# Patient Record
Sex: Female | Born: 1995 | Race: White | Hispanic: No | Smoking: Current every day smoker
Health system: Southern US, Community
[De-identification: ages and names within clinical notes are randomized; demographics above are authoritative.]

## PROBLEM LIST (undated history)

## (undated) DIAGNOSIS — F419 Anxiety disorder, unspecified: Secondary | ICD-10-CM

## (undated) DIAGNOSIS — K219 Gastro-esophageal reflux disease without esophagitis: Secondary | ICD-10-CM

---

## 2016-05-03 ENCOUNTER — Ambulatory Visit
Admission: EM | Admit: 2016-05-03 | Discharge: 2016-05-03 | Disposition: A | Discharge status: Home / Self Care | Payer: Self-pay | Attending: Family Medicine | Admitting: Family Medicine

## 2016-05-03 DIAGNOSIS — L708 Other acne: Secondary | ICD-10-CM

## 2016-05-03 DIAGNOSIS — L309 Dermatitis, unspecified: Secondary | ICD-10-CM

## 2016-05-03 MED ORDER — CLOTRIMAZOLE-BETAMETHASONE 1-0.05 % EX CREA: TOPICAL_CREAM | CUTANEOUS | 0 refills | Status: DC

## 2016-05-03 MED ORDER — DOXYCYCLINE HYCLATE 100 MG PO TABS: 100.0000 mg | ORAL_TABLET | Freq: Two times a day (BID) | ORAL | 0 refills | Status: DC

## 2016-05-03 MED ORDER — FLUCONAZOLE 200 MG PO TABS: 200.0000 mg | ORAL_TABLET | Freq: Every day | ORAL | 0 refills | Status: AC

## 2016-05-03 NOTE — ED Provider Notes (Signed)
[facility]    CSN: [id number] Arrival date & time: 05/03/16  1027  First Provider Contact:  First MD Initiated Contact with Patient 05/03/16 1121        History   Chief Complaint Chief Complaint  Patient presents with  . Rash    HPI Sonya Ferguson is a 20 y.o. female.   The history is provided by the patient.  Rash  Location:  Torso Torso rash location:  L axilla, R axilla, L chest and R chest (cleavage area) Quality: itchiness, redness and scaling   Severity:  Mild Onset quality:  Gradual Timing:  Constant Progression:  Unchanged Chronicity:  Chronic Context: not animal contact, not food, not hot tub use, not insect bite/sting, not medications, not new detergent/soap, not nuts, not plant contact and not sun exposure   Relieved by:  Nothing Worsened by:  Nothing Ineffective treatments:  Anti-fungal cream Associated symptoms: no abdominal pain and no diarrhea     History reviewed. No pertinent past medical history.  There are no active problems to display for this patient.   History reviewed. No pertinent surgical history.  OB History    No data available       Home Medications    Prior to Admission medications   Medication Sig Start Date End Date Taking? Authorizing Provider  clotrimazole-betamethasone (LOTRISONE) cream Apply to affected area 2 times daily prn 05/03/16   Duanne Limerick, MD  doxycycline (VIBRA-TABS) 100 MG tablet Take 1 tablet (100 mg total) by mouth 2 (two) times daily. 05/03/16   Duanne Limerick, MD  fluconazole (DIFLUCAN) 200 MG tablet Take 1 tablet (200 mg total) by mouth daily. 05/03/16 05/10/16  Duanne Limerick, MD    Family History History reviewed. No pertinent family history.  Social History Social History  Substance Use Topics  . Smoking status: Current Every Day Smoker    Packs/day: 0.50    Types: Cigarettes  . Smokeless tobacco: Never Used  . Alcohol use No     Allergies   Review of patient's allergies  indicates no known allergies.   Review of Systems Review of Systems  Constitutional: Negative.   HENT: Negative.   Eyes: Negative.   Respiratory: Negative.   Cardiovascular: Negative.   Gastrointestinal: Negative for abdominal pain and diarrhea.  Endocrine: Negative.   Genitourinary: Negative.   Musculoskeletal: Negative.   Skin: Positive for rash. Negative for color change, pallor and wound.     Physical Exam Triage Vital Signs ED Triage Vitals  Enc Vitals Group     BP 05/03/16 1054 138/73     Pulse Rate 05/03/16 1054 91     Resp 05/03/16 1054 18     Temp 05/03/16 1054 98.3 F (36.8 C)     Temp Source 05/03/16 1054 Oral     SpO2 05/03/16 1054 99 %     Weight 05/03/16 1055 260 lb (117.9 kg)     Height 05/03/16 1055 5\' 5"  (1.651 m)     Head Circumference --      Peak Flow --      Pain Score 05/03/16 1056 0     Pain Loc --      Pain Edu? --      Excl. in GC? --    No data found.   Updated Vital Signs BP 138/73 (BP Location: Left Arm)   Pulse 91   Temp 98.3 F (36.8 C) (Oral)   Resp 18   Ht 5\' 5"  (1.651 m)  Wt 260 lb (117.9 kg)   LMP 04/06/2016   SpO2 99%   BMI 43.27 kg/m   Visual Acuity Right Eye Distance:   Left Eye Distance:   Bilateral Distance:    Right Eye Near:   Left Eye Near:    Bilateral Near:     Physical Exam  Constitutional: She appears well-developed and well-nourished. No distress.  HENT:  Head: Normocephalic.  Right Ear: External ear normal.  Left Ear: External ear normal.  Nose: Nose normal.  Mouth/Throat: Oropharynx is clear and moist.  Eyes: Pupils are equal, round, and reactive to light.  Neck: Normal range of motion.  Cardiovascular: Normal rate, regular rhythm, normal heart sounds and intact distal pulses.  Exam reveals no gallop and no friction rub.   No murmur heard. Pulmonary/Chest: Effort normal and breath sounds normal. No respiratory distress. She has no wheezes. She has no rales. She exhibits no tenderness.    Abdominal: Soft. There is no tenderness.  Skin: Rash noted. She is not diaphoretic. There is erythema.     UC Treatments / Results  Labs (all labs ordered are listed, but only abnormal results are displayed) Labs Reviewed - No data to display  EKG  EKG Interpretation None       Radiology No results found.  Procedures Procedures (including critical care time)  Medications Ordered in UC Medications - No data to display   Initial Impression / Assessment and Plan / UC Course  I have reviewed the triage vital signs and the nursing notes.  Pertinent labs & imaging results that were available during my care of the patient were reviewed by me and considered in my medical decision making (see chart for details).  Clinical Course      Final Clinical Impressions(s) / UC Diagnoses   Final diagnoses:  Acne-like skin bumps    New Prescriptions New Prescriptions   CLOTRIMAZOLE-BETAMETHASONE (LOTRISONE) CREAM    Apply to affected area 2 times daily prn   DOXYCYCLINE (VIBRA-TABS) 100 MG TABLET    Take 1 tablet (100 mg total) by mouth 2 (two) times daily.   FLUCONAZOLE (DIFLUCAN) 200 MG TABLET    Take 1 tablet (200 mg total) by mouth daily.     Duanne Limerickeanna C Ilisha Blust, MD 05/03/16 1143

## 2016-05-03 NOTE — ED Triage Notes (Addendum)
Patient c/o a rash on her chest and back area that will not clear up with previous prescribed medication. He states that she had a fungal rash in high school that looks similar.  She also c/o itching.

## 2018-03-26 ENCOUNTER — Emergency Department
Admission: EM | Admit: 2018-03-26 | Discharge: 2018-03-26 | Disposition: A | Discharge status: Home / Self Care | Payer: BLUE CROSS/BLUE SHIELD | Attending: Emergency Medicine | Admitting: Emergency Medicine

## 2018-03-26 ENCOUNTER — Other Ambulatory Visit: Payer: Self-pay

## 2018-03-26 DIAGNOSIS — S51812A Laceration without foreign body of left forearm, initial encounter: Secondary | ICD-10-CM | POA: Diagnosis not present

## 2018-03-26 DIAGNOSIS — Y929 Unspecified place or not applicable: Secondary | ICD-10-CM | POA: Insufficient documentation

## 2018-03-26 DIAGNOSIS — Y9389 Activity, other specified: Secondary | ICD-10-CM | POA: Diagnosis not present

## 2018-03-26 DIAGNOSIS — F17210 Nicotine dependence, cigarettes, uncomplicated: Secondary | ICD-10-CM | POA: Insufficient documentation

## 2018-03-26 DIAGNOSIS — Y999 Unspecified external cause status: Secondary | ICD-10-CM | POA: Diagnosis not present

## 2018-03-26 DIAGNOSIS — F329 Major depressive disorder, single episode, unspecified: Secondary | ICD-10-CM | POA: Insufficient documentation

## 2018-03-26 DIAGNOSIS — Z7289 Other problems related to lifestyle: Secondary | ICD-10-CM

## 2018-03-26 DIAGNOSIS — X781XXA Intentional self-harm by knife, initial encounter: Secondary | ICD-10-CM | POA: Diagnosis not present

## 2018-03-26 DIAGNOSIS — F1290 Cannabis use, unspecified, uncomplicated: Secondary | ICD-10-CM | POA: Diagnosis not present

## 2018-03-26 DIAGNOSIS — S59912A Unspecified injury of left forearm, initial encounter: Secondary | ICD-10-CM | POA: Diagnosis present

## 2018-03-26 DIAGNOSIS — IMO0002 Reserved for concepts with insufficient information to code with codable children: Secondary | ICD-10-CM

## 2018-03-26 LAB — COMPREHENSIVE METABOLIC PANEL
ALBUMIN: 4.5 g/dL (ref 3.5–5.0)
ALT: 16 U/L (ref 0–44)
AST: 19 U/L (ref 15–41)
Alkaline Phosphatase: 53 U/L (ref 38–126)
Anion gap: 7 (ref 5–15)
BUN: 20 mg/dL (ref 6–20)
CHLORIDE: 106 mmol/L (ref 98–111)
CO2: 25 mmol/L (ref 22–32)
Calcium: 9.3 mg/dL (ref 8.9–10.3)
Creatinine, Ser: 0.82 mg/dL (ref 0.44–1.00)
GFR calc Af Amer: >60 mL/min (ref >60)
GFR calc non Af Amer: >60 mL/min (ref >60)
GLUCOSE: 108 mg/dL — AB (ref 70–99)
POTASSIUM: 3.2 mmol/L — AB (ref 3.5–5.1)
Sodium: 138 mmol/L (ref 135–145)
Total Bilirubin: 0.7 mg/dL (ref 0.3–1.2)
Total Protein: 8 g/dL (ref 6.5–8.1)

## 2018-03-26 LAB — URINALYSIS, COMPLETE (UACMP) WITH MICROSCOPIC
Bacteria, UA: NONE SEEN
Bilirubin Urine: NEGATIVE
GLUCOSE, UA: NEGATIVE mg/dL
Ketones, ur: 5 mg/dL — AB
Leukocytes, UA: NEGATIVE
Nitrite: NEGATIVE
PROTEIN: NEGATIVE mg/dL
Specific Gravity, Urine: 1.027 (ref 1.005–1.030)
pH: 5 (ref 5.0–8.0)

## 2018-03-26 LAB — CBC WITH DIFFERENTIAL/PLATELET
BASOS ABS: 0.1 10*3/uL (ref 0–0.1)
Basophils Relative: 1 %
EOS PCT: 3 %
Eosinophils Absolute: 0.3 10*3/uL (ref 0–0.7)
HCT: 43 % (ref 35.0–47.0)
Hemoglobin: 14.8 g/dL (ref 12.0–16.0)
Lymphocytes Relative: 33 %
Lymphs Abs: 3.5 10*3/uL (ref 1.0–3.6)
MCH: 29.3 pg (ref 26.0–34.0)
MCHC: 34.5 g/dL (ref 32.0–36.0)
MCV: 84.8 fL (ref 80.0–100.0)
MONO ABS: 0.9 10*3/uL (ref 0.2–0.9)
Monocytes Relative: 8 %
NEUTROS ABS: 6 10*3/uL (ref 1.4–6.5)
Neutrophils Relative %: 55 %
PLATELETS: 254 10*3/uL (ref 150–440)
RBC: 5.07 MIL/uL (ref 3.80–5.20)
RDW: 13.3 % (ref 11.5–14.5)
WBC: 10.8 10*3/uL (ref 3.6–11.0)

## 2018-03-26 LAB — SALICYLATE LEVEL: Salicylate Lvl: <7 mg/dL (ref 2.8–30.0)

## 2018-03-26 LAB — URINE DRUG SCREEN, QUALITATIVE (ARMC ONLY)
Amphetamines, Ur Screen: NOT DETECTED
Barbiturates, Ur Screen: NOT DETECTED
CANNABINOID 50 NG, UR ~~LOC~~: POSITIVE — AB
Cocaine Metabolite,Ur ~~LOC~~: NOT DETECTED
MDMA (ECSTASY) UR SCREEN: NOT DETECTED
Methadone Scn, Ur: NOT DETECTED
Opiate, Ur Screen: NOT DETECTED
PHENCYCLIDINE (PCP) UR S: NOT DETECTED
Tricyclic, Ur Screen: NOT DETECTED

## 2018-03-26 LAB — ETHANOL: Alcohol, Ethyl (B): <10 mg/dL (ref <10)

## 2018-03-26 LAB — ACETAMINOPHEN LEVEL: Acetaminophen (Tylenol), Serum: <10 ug/mL — ABNORMAL LOW (ref 10–30)

## 2018-03-26 MED ORDER — LIDOCAINE HCL (PF) 1 % IJ SOLN
INTRAMUSCULAR | Status: AC
  Administered 2018-03-26: 2 mL
  Filled 2018-03-26: qty 5

## 2018-03-26 MED ORDER — LIDOCAINE HCL (PF) 1 % IJ SOLN
2.0000 mL | Freq: Once | INTRAMUSCULAR | Status: AC
  Administered 2018-03-26: 2 mL

## 2018-03-26 MED ORDER — LORAZEPAM 1 MG PO TABS
1.0000 mg | ORAL_TABLET | Freq: Once | ORAL | Status: AC
  Administered 2018-03-26: 1 mg via ORAL
  Filled 2018-03-26: qty 1

## 2018-03-26 NOTE — ED Notes (Signed)
SOC given report on patient[facility]

## 2018-03-26 NOTE — ED Notes (Signed)
Pt dressing for discharge. Pt accepting of disposition.   Maintained on 15 minute checks and observation by security camera for safety.

## 2018-03-26 NOTE — ED Provider Notes (Signed)
[facility] Emergency Department Provider Note   ____________________________________________   First MD Initiated Contact with Patient 03/26/18 0209     (approximate)  I have reviewed the triage vital signs and the nursing notes.   HISTORY  Chief Complaint Extremity Laceration    HPI Sonya Ferguson is a 22 y.o. female brought to the ED via EMS from home under IVC with a chief complaint of self-inflicted wound.  Patient reports she was arguing with her boyfriend and was holding a clean kitchen knife.  States she did not mean to cut herself.  Denies prior psychiatric illness or hospitalization.  Currently denies active SI/HI/AH/VH.  Tetanus is up-to-date.  Other than left forearm laceration, patient voices no medical complaints.   Past medical history None  There are no active problems to display for this patient.   History reviewed. No pertinent surgical history.  Prior to Admission medications   Medication Sig Start Date End Date Taking? Authorizing Provider  clotrimazole-betamethasone (LOTRISONE) cream Apply to affected area 2 times daily prn Patient not taking: Reported on 03/26/2018 05/03/16   Duanne Limerick, MD  doxycycline (VIBRA-TABS) 100 MG tablet Take 1 tablet (100 mg total) by mouth 2 (two) times daily. Patient not taking: Reported on 03/26/2018 05/03/16   Duanne Limerick, MD    Allergies Patient has no known allergies.  No family history on file.  Social History Social History   Tobacco Use  . Smoking status: Current Every Day Smoker    Packs/day: 0.50    Types: Cigarettes  . Smokeless tobacco: Never Used  Substance Use Topics  . Alcohol use: No  . Drug use: No    Review of Systems  Constitutional: No fever/chills Eyes: No visual changes. ENT: No sore throat. Cardiovascular: Denies chest pain. Respiratory: Denies shortness of breath. Gastrointestinal: No abdominal pain.  No nausea, no vomiting.  No diarrhea.  No  constipation. Genitourinary: Negative for dysuria. Musculoskeletal: Positive for left forearm laceration.  Negative for back pain. Skin: Negative for rash. Neurological: Negative for headaches, focal weakness or numbness. Psychiatric:Positive for self-inflicted wound.   ____________________________________________   PHYSICAL EXAM:  VITAL SIGNS: ED Triage Vitals  Enc Vitals Group     BP --      Pulse --      Resp --      Temp --      Temp src --      SpO2 --      Weight 03/26/18 0203 206 lb (93.4 kg)     Height 03/26/18 0203 5\' 6"  (1.676 m)     Head Circumference --      Peak Flow --      Pain Score 03/26/18 0202 3     Pain Loc --      Pain Edu? --      Excl. in GC? --     Constitutional: Alert and oriented. Well appearing and in mild acute distress. Eyes: Conjunctivae are normal. PERRL. EOMI. Head: Atraumatic. Nose: No congestion/rhinnorhea. Mouth/Throat: Mucous membranes are moist.  Oropharynx non-erythematous. Neck: No stridor.   Cardiovascular: Normal rate, regular rhythm. Grossly normal heart sounds.  Good peripheral circulation. Respiratory: Normal respiratory effort.  No retractions. Lungs CTAB. Gastrointestinal: Soft and nontender. No distention. No abdominal bruits. No CVA tenderness. Musculoskeletal:  Left forearm: Approximately 5 cm transverse linear laceration across midforearm with adipose tissue only.  No active bleeding.  2+ radial pulse.  Brisk, less than 5-second capillary refill. No lower extremity tenderness nor  edema.  No joint effusions. Neurologic:  Normal speech and language. No gross focal neurologic deficits are appreciated. No gait instability. Skin:  Skin is warm, dry and intact. No rash noted. Psychiatric: Mood and affect are normal. Speech and behavior are normal.  ____________________________________________   LABS (all labs ordered are listed, but only abnormal results are displayed)  Labs Reviewed  COMPREHENSIVE METABOLIC PANEL -  Abnormal; Notable for the following components:      Result Value   Potassium 3.2 (*)    Glucose, Bld 108 (*)    All other components within normal limits  ACETAMINOPHEN LEVEL - Abnormal; Notable for the following components:   Acetaminophen (Tylenol), Serum <10 (*)    All other components within normal limits  URINALYSIS, COMPLETE (UACMP) WITH MICROSCOPIC - Abnormal; Notable for the following components:   Color, Urine YELLOW (*)    APPearance CLEAR (*)    Hgb urine dipstick MODERATE (*)    Ketones, ur 5 (*)    All other components within normal limits  URINE DRUG SCREEN, QUALITATIVE ([facility] ONLY) - Abnormal; Notable for the following components:   Cannabinoid 50 Ng, Ur [place] POSITIVE (*)    Benzodiazepine, Ur Scrn TEST NOT PERFORMED, REAGENT NOT AVAILABLE (*)    All other components within normal limits  CBC WITH DIFFERENTIAL/PLATELET  SALICYLATE LEVEL  ETHANOL  POC URINE PREG, ED   ____________________________________________  EKG  None ____________________________________________  RADIOLOGY  ED MD interpretation: None  Official radiology report(s): No results found.  ____________________________________________   PROCEDURES  Procedure(s) performed:    Marland Kitchen.Marland Kitchen.Laceration Repair Date/Time: 03/26/2018 2:45 AM Performed by: Irean HongSung, Jade J, MD Authorized by: Irean HongSung, Jade J, MD   Consent:    Consent obtained:  Verbal   Consent given by:  Patient   Risks discussed:  Infection, pain, poor cosmetic result and poor wound healing   Alternatives discussed:  No treatment Anesthesia (see MAR for exact dosages):    Anesthesia method:  Local infiltration   Local anesthetic:  Lidocaine 1% w/o epi Laceration details:    Location:  Shoulder/arm   Shoulder/arm location:  L lower arm   Length (cm):  5   Depth (mm):  1 Repair type:    Repair type:  Simple Pre-procedure details:    Preparation:  Patient was prepped and draped in usual sterile fashion Exploration:    Hemostasis  achieved with:  Direct pressure   Wound exploration: entire depth of wound probed and visualized     Contaminated: no   Treatment:    Area cleansed with:  Saline and Betadine   Amount of cleaning:  Standard   Irrigation solution:  Sterile saline   Irrigation method:  Pressure wash   Visualized foreign bodies/material removed: no   Skin repair:    Repair method:  Sutures   Suture size:  5-0   Suture technique:  Running Approximation:    Approximation:  Close Post-procedure details:    Dressing:  Sterile dressing   Patient tolerance of procedure:  Tolerated well, no immediate complications    Critical Care performed: No  ____________________________________________   INITIAL IMPRESSION / ASSESSMENT AND PLAN / ED COURSE  As part of my medical decision making, I reviewed the following data within the electronic MEDICAL RECORD NUMBER Nursing notes reviewed and incorporated, Labs reviewed, A consult was requested and obtained from this/these consultant(s) Psychiatry and Notes from prior ED visits   22 year old female who presents to the ED under IVC for self-inflicted wound.  Wound repaired  with sutures.  Patient will remain under IVC pending psychiatry evaluation and disposition.  Clinical Course as of Mar 27 615  Fri Mar 26, 2018  6045 Patient was evaluated by [facility] psychiatrist Dr. Jacky Kindle who recommends discharge home and will rescind IVC.  Strict return precautions given.  Patient verbalizes understanding agrees with plan of care.   [JS]    Clinical Course User Index [JS] Irean Hong, MD     ____________________________________________   FINAL CLINICAL IMPRESSION(S) / ED DIAGNOSES  Final diagnoses:  Self-inflicted injury  Laceration of left forearm, initial encounter  Marijuana use     ED Discharge Orders    None       Note:  This document was prepared using Dragon voice recognition software and may include unintentional dictation errors.    Irean Hong,  MD 03/26/18 [phone]

## 2018-03-26 NOTE — Discharge Instructions (Addendum)
Suture removal in 7 to 10 days. Return to the ER for worsening symptoms, increased redness/swelling around wound, purulent discharge, feelings of hurting yourself or others, or other concerns.

## 2018-03-26 NOTE — ED Notes (Signed)
Pt discharged to lobby with car keys. (car is in parking lot).  VS stable. Discharged paperwork reviewed with patient. Patient signed for discharge. All belongings returned to patient. Pt denies SI/HI.

## 2018-03-26 NOTE — ED Notes (Signed)
Pt belongings- 2 nipple rings, 2 nose rings, 1 top of dermmal ring,  1 lip ring, 2 black gauges, 3 ear rings, 1 tongue ring. Black necklace brown rock. Black t-shirt, black tank  pink bra, blue sports bra, pajamas pants,  Underwear, pink flip flops, 1 cell phone.

## 2018-03-26 NOTE — BH Assessment (Signed)
Assessment Note  Sonya Ferguson is an 22 y.o. female to ED via [facility] after cutting her wrist. Pt reportedly lost her job and had a dispute with boyfriend which caused her to become overwhelmed. Pt denies SI, HI, AH, and VH. Pt reports, "I don't think I had a thought process at the time. I lost my job and I just felt like I was failing everybody." Pt denies any behavioral health hx although she does admit to feeling as though she has depression. Pt denies any other triggers or potential conflicts. Pt reports she has felt unmotivated and withdrawn from others for quite some time. Pt also admits to weekly marijuana use.   Pt calm, tearful, and cooperative at time of assessment. Oriented x4.  Diagnosis: Depression  Past Medical History: History reviewed. No pertinent past medical history.  History reviewed. No pertinent surgical history.  Family History: No family history on file.  Social History:  reports that she has been smoking cigarettes. She has been smoking about 0.50 packs per day. She has never used smokeless tobacco. She reports that she does not drink alcohol or use drugs.  Additional Social History:  Alcohol / Drug Use Pain Medications: see PTA Prescriptions: see PTA Over the Counter: see PTA History of alcohol / drug use?: Yes Longest period of sobriety (when/how long): unable to quanitfy Substance #1 Name of Substance 1: marijuana 1 - Age of First Use: 19 1 - Amount (size/oz): varies 1 - Frequency: at least once per week 1 - Duration: varies 1 - Last Use / Amount: last Tuesday  CIWA: CIWA-Ar BP: (!) 129/91 Pulse Rate: 97 COWS:    Allergies: No Known Allergies  Home Medications:  (Not in a hospital admission)  OB/GYN Status:  Patient's last menstrual period was 03/16/2018 (exact date).  General Assessment Data Location of Assessment: [facility] ED TTS Assessment: In system Is this a Tele or Face-to-Face Assessment?: Face-to-Face Is this an Initial Assessment or a  Re-assessment for this encounter?: Initial Assessment Marital status: Single Is patient pregnant?: No Pregnancy Status: No Living Arrangements: Spouse/significant other Can pt return to current living arrangement?: Yes Admission Status: Involuntary Is patient capable of signing voluntary admission?: No Referral Source: Self/Family/Friend Insurance type: [employer] Screening Exam [facility] Walk-in ONLY) Medical Exam completed: Yes  Crisis Care Plan Living Arrangements: Spouse/significant other Legal Guardian: Other:(self) Name of Psychiatrist: none Name of Therapist: none  Education Status Is patient currently in school?: No Is the patient employed, unemployed or receiving disability?: Unemployed  Risk to self with the past 6 months Suicidal Ideation: No-Not Currently/Within Last 6 Months Has patient been a risk to self within the past 6 months prior to admission? : Yes Suicidal Intent: No-Not Currently/Within Last 6 Months Has patient had any suicidal intent within the past 6 months prior to admission? : Other (comment)(Pt denies despite self inflicted lacerations) Is patient at risk for suicide?: No, but patient needs Medical Clearance Suicidal Plan?: No Has patient had any suicidal plan within the past 6 months prior to admission? : No Access to Means: Yes Specify Access to Suicidal Means: pt has access to sharp objects, razors & knives What has been your use of drugs/alcohol within the last 12 months?: pt reports to smoking marijuna at least once per week Previous Attempts/Gestures: No How many times?: 0 Other Self Harm Risks: none reported Triggers for Past Attempts: None known Intentional Self Injurious Behavior: Cutting Comment - Self Injurious Behavior: pt presented to ED with self  inflicted cuts to wrist Family Suicide History: No Recent stressful life event(s): Conflict (Comment), Job Loss Persecutory voices/beliefs?: No Depression: Yes Depression  Symptoms: Despondent, Tearfulness, Isolating, Loss of interest in usual pleasures, Feeling worthless/self pity Substance abuse history and/or treatment for substance abuse?: Yes Suicide prevention information given to non-admitted patients: Not applicable  Risk to Others within the past 6 months Homicidal Ideation: No Does patient have any lifetime risk of violence toward others beyond the six months prior to admission? : No Thoughts of Harm to Others: No Current Homicidal Intent: No Current Homicidal Plan: No Access to Homicidal Means: No Identified Victim: None reported History of harm to others?: No Assessment of Violence: None Noted Violent Behavior Description: none noted Does patient have access to weapons?: No Criminal Charges Pending?: No Does patient have a court date: No Is patient on probation?: No  Psychosis Hallucinations: None noted Delusions: None noted  Mental Status Report Appearance/Hygiene: Unremarkable Eye Contact: Good Motor Activity: Freedom of movement Speech: Logical/coherent, Soft Level of Consciousness: Alert Mood: Depressed, Anxious Affect: Depressed Anxiety Level: Minimal Thought Processes: Coherent, Relevant Judgement: Partial Orientation: Appropriate for developmental age Obsessive Compulsive Thoughts/Behaviors: None  Cognitive Functioning Concentration: Good Memory: Remote Intact, Recent Impaired Is patient IDD: No Is patient DD?: No Insight: Fair Impulse Control: Poor Appetite: Good Have you had any weight changes? : No Change Sleep: No Change Total Hours of Sleep: 8 Vegetative Symptoms: Staying in bed  ADLScreening [facility] Patient's cognitive ability adequate to safely complete daily activities?: Yes Patient able to express need for assistance with ADLs?: Yes Independently performs ADLs?: Yes (appropriate for developmental age)  Prior Inpatient Therapy Prior Inpatient Therapy: No  Prior Outpatient  Therapy Prior Outpatient Therapy: No Does patient have an ACCT team?: No Does patient have Intensive In-House Services?  : No Does patient have [facility] services? : No Does patient have P4CC services?: No  ADL Screening (condition at time of admission) Patient's cognitive ability adequate to safely complete daily activities?: Yes Is the patient deaf or have difficulty hearing?: No Does the patient have difficulty seeing, even when wearing glasses/contacts?: No Does the patient have difficulty concentrating, remembering, or making decisions?: No Patient able to express need for assistance with ADLs?: Yes Does the patient have difficulty dressing or bathing?: No Independently performs ADLs?: Yes (appropriate for developmental age) Does the patient have difficulty walking or climbing stairs?: No Weakness of Legs: None Weakness of Arms/Hands: None  Home Assistive Devices/Equipment Home Assistive Devices/Equipment: None  Therapy Consults (therapy consults require a physician order) PT Evaluation Needed: No OT Evalulation Needed: No SLP Evaluation Needed: No Abuse/Neglect Assessment (Assessment to be complete while patient is alone) Abuse/Neglect Assessment Can Be Completed: Yes Physical Abuse: Denies Verbal Abuse: Denies Sexual Abuse: Denies Exploitation of patient/patient's resources: Denies Self-Neglect: Denies Values / Beliefs Cultural Requests During Hospitalization: None Spiritual Requests During Hospitalization: None Consults Spiritual Care Consult Needed: No Social Work Consult Needed: No Merchant navy officerAdvance Directives (For Healthcare) Does Patient Have a Medical Advance Directive?: No    Additional Information 1:1 In Past 12 Months?: No CIRT Risk: No Elopement Risk: No Does patient have medical clearance?: Yes     Disposition:  Disposition Initial Assessment Completed for this Encounter: Yes Disposition of Patient: Discharge Patient refused recommended treatment: No Mode  of transportation if patient is discharged?: Other Patient referred to: Other (Comment)  On Site Evaluation by:   Reviewed with Physician:    Aubery LappingJerrica  Jodeci Rini, MS, [facility] 03/26/2018 6:22 AM

## 2018-03-26 NOTE — ED Triage Notes (Addendum)
Pt arrives to ED via [facility] from home with c/o self-inflicted laceration to the left forearm. EMS reports a 2" transverse laceration across the mid-left forearm with subcutaneous fat tissue exposed. Pt denies previous h/x of cutting, no previous psych h/x. Pt denies drug or ETOH use PTA. [place] accompanied pt to the ED reports IVC paperwork is en route.

## 2018-03-26 NOTE — ED Notes (Signed)
ED Provider at bedside. 

## 2018-03-26 NOTE — ED Notes (Signed)
Patient woken by this Clinical research associatewriter and encouraged to eat breakfast. Patient informed her discharge paperwork was ready.  Patient will be given a little bit of time to fully wake up before leaving. Patient will be driving herself home. Denies SI/HI.  Denies pain.   Maintained on 15 minute checks and observation by security camera for safety.

## 2020-01-05 ENCOUNTER — Emergency Department
Admission: EM | Admit: 2020-01-05 | Discharge: 2020-01-05 | Disposition: A | Discharge status: Home / Self Care | Payer: BLUE CROSS/BLUE SHIELD | Attending: Emergency Medicine | Admitting: Emergency Medicine

## 2020-01-05 ENCOUNTER — Encounter: Payer: Self-pay | Admitting: Emergency Medicine

## 2020-01-05 ENCOUNTER — Other Ambulatory Visit: Payer: Self-pay

## 2020-01-05 ENCOUNTER — Emergency Department: Payer: BLUE CROSS/BLUE SHIELD

## 2020-01-05 DIAGNOSIS — Y9389 Activity, other specified: Secondary | ICD-10-CM | POA: Insufficient documentation

## 2020-01-05 DIAGNOSIS — Y929 Unspecified place or not applicable: Secondary | ICD-10-CM | POA: Diagnosis not present

## 2020-01-05 DIAGNOSIS — S93602A Unspecified sprain of left foot, initial encounter: Secondary | ICD-10-CM

## 2020-01-05 DIAGNOSIS — W1849XA Other slipping, tripping and stumbling without falling, initial encounter: Secondary | ICD-10-CM | POA: Insufficient documentation

## 2020-01-05 DIAGNOSIS — Y999 Unspecified external cause status: Secondary | ICD-10-CM | POA: Insufficient documentation

## 2020-01-05 DIAGNOSIS — S99922A Unspecified injury of left foot, initial encounter: Secondary | ICD-10-CM | POA: Diagnosis present

## 2020-01-05 DIAGNOSIS — F17210 Nicotine dependence, cigarettes, uncomplicated: Secondary | ICD-10-CM | POA: Insufficient documentation

## 2020-01-05 MED ORDER — NAPROXEN 500 MG PO TABS
500.0000 mg | ORAL_TABLET | Freq: Once | ORAL | Status: AC
  Administered 2020-01-05: 500 mg via ORAL
  Filled 2020-01-05: qty 1

## 2020-01-05 MED ORDER — NAPROXEN 375 MG PO TABS: 375.0000 mg | ORAL_TABLET | Freq: Two times a day (BID) | ORAL | 0 refills | Status: DC

## 2020-01-05 MED ORDER — TRAMADOL HCL 50 MG PO TABS: 50.0000 mg | ORAL_TABLET | Freq: Four times a day (QID) | ORAL | 0 refills | Status: DC | PRN

## 2020-01-05 NOTE — ED Triage Notes (Signed)
Patient reports pain in left food since waking up this morning. Worse when walking around. Denies any known injury.

## 2020-01-05 NOTE — ED Provider Notes (Signed)
[facility] Emergency Department Provider Note   ____________________________________________   First MD Initiated Contact with Patient 01/05/20 1400     (approximate)  I have reviewed the triage vital signs and the nursing notes.   HISTORY  Chief Complaint Foot Pain    HPI Sonya Ferguson is a 24 y.o. female patient complains of left foot pain since last night.  Patient that she got up to go to the bathroom and tripped over a cord.  Patient that she did not fall.  Patient says she was able to sleep without discomfort.  Patient stated waking this morning with mild pain to dorsal aspect left foot which increased while she was at work today.  Patient state her work requires prolonged standing.  Patient denies loss sensation or loss of function.  Patient rates her pain a 7/10.  Patient described pain is "achy".  No palliative measures for complaint.         History reviewed. No pertinent past medical history.  There are no problems to display for this patient.   History reviewed. No pertinent surgical history.  Prior to Admission medications   Medication Sig Start Date End Date Taking? Authorizing Provider  naproxen (NAPROSYN) 375 MG tablet Take 1 tablet (375 mg total) by mouth 2 (two) times daily with a meal. 01/05/20   Sable Feil, PA-C  traMADol (ULTRAM) 50 MG tablet Take 1 tablet (50 mg total) by mouth every 6 (six) hours as needed for moderate pain. 01/05/20   Sable Feil, PA-C    Allergies Patient has no known allergies.  No family history on file.  Social History Social History   Tobacco Use  . Smoking status: Current Every Day Smoker    Packs/day: 0.50    Types: Cigarettes  . Smokeless tobacco: Never Used  Substance Use Topics  . Alcohol use: No  . Drug use: No    Review of Systems .Constitutional: No fever/chills Eyes: No visual changes. ENT: No sore throat. Cardiovascular: Denies chest pain. Respiratory: Denies  shortness of breath. Gastrointestinal: No abdominal pain.  No nausea, no vomiting.  No diarrhea.  No constipation. Genitourinary: Negative for dysuria. Musculoskeletal: Left dorsal foot pain Skin: Negative for rash. Neurological: Negative for headaches, focal weakness or numbness.   ____________________________________________   PHYSICAL EXAM:  VITAL SIGNS: ED Triage Vitals  Enc Vitals Group     BP 01/05/20 1351 132/82     Pulse Rate 01/05/20 1351 93     Resp 01/05/20 1351 18     Temp 01/05/20 1351 99.3 F (37.4 C)     Temp Source 01/05/20 1351 Oral     SpO2 01/05/20 1351 99 %     Weight 01/05/20 1349 212 lb (96.2 kg)     Height 01/05/20 1349 5\' 6"  (1.676 m)     Head Circumference --      Peak Flow --      Pain Score 01/05/20 1348 7     Pain Loc --      Pain Edu? --      Excl. in [place]? --    Constitutional: Alert and oriented. Well appearing and in no acute distress. Cardiovascular: Normal rate, regular rhythm. Grossly normal heart sounds.  Good peripheral circulation. Respiratory: Normal respiratory effort.  No retractions. Lungs CTAB. Musculoskeletal: No obvious deformity to the left foot.  Neurologic:  Normal speech and language. No gross focal neurologic deficits are appreciated. No gait instability. Skin:  Skin is warm, dry and  intact. No rash noted.  No abrasion or ecchymosis. Psychiatric: Mood and affect are normal. Speech and behavior are normal.  ____________________________________________   LABS (all labs ordered are listed, but only abnormal results are displayed)  Labs Reviewed - No data to display ____________________________________________  EKG   ____________________________________________  RADIOLOGY  ED MD interpretation:    Official radiology report(s): DG Foot Complete Left  Result Date: 01/05/2020 CLINICAL DATA:  Pain and swelling EXAM: LEFT FOOT - COMPLETE 3+ VIEW COMPARISON:  None. FINDINGS: Frontal, oblique, and lateral views obtained.  No fracture or dislocation. Joint spaces appear normal. No erosive change. IMPRESSION: No fracture or dislocation.  No evident arthropathy. Electronically Signed   By: Bretta Bang III M.D.   On: 01/05/2020 14:18    ____________________________________________   PROCEDURES  Procedure(s) performed (including Critical Care):  Procedures   ____________________________________________   INITIAL IMPRESSION / ASSESSMENT AND PLAN / ED COURSE  As part of my medical decision making, I reviewed the following data within the electronic MEDICAL RECORD NUMBER     Patient presents for left foot pain.  Discussed neck x-ray findings with patient.  Patient complaint physical exam consistent with sprain foot.  Patient given discharge care instruction work note.  Patient Everlean Alstrom take medication as directed.  Patient advised [facility].    Sonya Ferguson was evaluated in Emergency Department on 01/05/2020 for the symptoms described in the history of present illness. She was evaluated in the context of the global COVID-19 pandemic, which necessitated consideration that the patient might be at risk for infection with the SARS-CoV-2 virus that causes COVID-19. Institutional protocols and algorithms that pertain to the evaluation of patients at risk for COVID-19 are in a state of rapid change based on information released by regulatory bodies including the CDC and federal and state organizations. These policies and algorithms were followed during the patient's care in the ED.       ____________________________________________   FINAL CLINICAL IMPRESSION(S) / ED DIAGNOSES  Final diagnoses:  Sprain of left foot, initial encounter     ED Discharge Orders         Ordered    naproxen (NAPROSYN) 375 MG tablet  2 times daily with meals     01/05/20 1518    traMADol (ULTRAM) 50 MG tablet  Every 6 hours PRN     01/05/20 1518           Note:  This document was  prepared using Dragon voice recognition software and may include unintentional dictation errors.    Joni Reining, PA-C 01/05/20 1522    Shaune Pollack, MD 01/05/20 2034

## 2020-11-22 ENCOUNTER — Emergency Department
Admission: EM | Admit: 2020-11-22 | Discharge: 2020-11-22 | Disposition: A | Discharge status: Home / Self Care | Payer: BLUE CROSS/BLUE SHIELD | Attending: Emergency Medicine | Admitting: Emergency Medicine

## 2020-11-22 ENCOUNTER — Other Ambulatory Visit: Payer: Self-pay

## 2020-11-22 ENCOUNTER — Emergency Department: Payer: BLUE CROSS/BLUE SHIELD

## 2020-11-22 DIAGNOSIS — S93401A Sprain of unspecified ligament of right ankle, initial encounter: Secondary | ICD-10-CM | POA: Insufficient documentation

## 2020-11-22 DIAGNOSIS — W1841XA Slipping, tripping and stumbling without falling due to stepping on object, initial encounter: Secondary | ICD-10-CM | POA: Diagnosis not present

## 2020-11-22 DIAGNOSIS — Z79899 Other long term (current) drug therapy: Secondary | ICD-10-CM | POA: Insufficient documentation

## 2020-11-22 DIAGNOSIS — F17210 Nicotine dependence, cigarettes, uncomplicated: Secondary | ICD-10-CM | POA: Diagnosis not present

## 2020-11-22 DIAGNOSIS — M25571 Pain in right ankle and joints of right foot: Secondary | ICD-10-CM | POA: Diagnosis present

## 2020-11-22 MED ORDER — MELOXICAM 7.5 MG PO TABS
15.0000 mg | ORAL_TABLET | Freq: Once | ORAL | Status: AC
  Administered 2020-11-22: 15 mg via ORAL
  Filled 2020-11-22: qty 2

## 2020-11-22 MED ORDER — MELOXICAM 15 MG PO TABS: 15.0000 mg | ORAL_TABLET | Freq: Every day | ORAL | 0 refills | Status: DC

## 2020-11-22 NOTE — ED Notes (Signed)
Patient reports "falling onto" right ankle after stepping out of her truck at work today. Patient is noted to have swelling to the lateral aspect of the right ankle. Ice provided. Elevation of extremity encouraged.

## 2020-11-22 NOTE — ED Notes (Signed)
Spoke with pts employer, Elam Dutch with [employer] and he stated that nothing was needed to file WC.

## 2020-11-22 NOTE — ED Provider Notes (Signed)
[facility] Emergency Department Provider Note  ____________________________________________  Time seen: Approximately 7:16 PM  I have reviewed the triage vital signs and the nursing notes.   HISTORY  Chief Complaint Ankle Pain    HPI Sonya Ferguson is a 25 y.o. female who presents the emergency department complaining of right ankle pain.  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  Patient is having pain, edema to the lateral aspect of the ankle but pain radiates across the anterior aspect.  Patient states that she can put weight on it but doing so drastically increases the pain.  No other injury or complaint is reported at this time.         No past medical history on file.  There are no problems to display for this patient.   No past surgical history on file.  Prior to Admission medications   Medication Sig Start Date End Date Taking? Authorizing Provider  meloxicam (MOBIC) 15 MG tablet Take 1 tablet (15 mg total) by mouth daily. 11/22/20  Yes Shaylene Paganelli, Delorise Royals, PA-C  naproxen (NAPROSYN) 375 MG tablet Take 1 tablet (375 mg total) by mouth 2 (two) times daily with a meal. 01/05/20   Joni Reining, PA-C  traMADol (ULTRAM) 50 MG tablet Take 1 tablet (50 mg total) by mouth every 6 (six) hours as needed for moderate pain. 01/05/20   Joni Reining, PA-C    Allergies Patient has no known allergies.  No family history on file.  Social History Social History   Tobacco Use  . Smoking status: Current Every Day Smoker    Packs/day: 0.50    Types: Cigarettes  . Smokeless tobacco: Never Used  Substance Use Topics  . Alcohol use: No  . Drug use: No     Review of Systems  Constitutional: No fever/chills Eyes: No visual changes. No discharge ENT: No upper respiratory complaints. Cardiovascular: no chest pain. Respiratory: no cough. No  SOB. Gastrointestinal: No abdominal pain.  No nausea, no vomiting.  No diarrhea.  No constipation. Musculoskeletal: Right ankle pain/injury Skin: Negative for rash, abrasions, lacerations, ecchymosis. Neurological: Negative for headaches, focal weakness or numbness.  10 System ROS otherwise negative.  ____________________________________________   PHYSICAL EXAM:  VITAL SIGNS: ED Triage Vitals  Enc Vitals Group     BP 11/22/20 1854 134/86     Pulse Rate 11/22/20 1854 92     Resp 11/22/20 1854 18     Temp 11/22/20 1854 99.5 F (37.5 C)     Temp Source 11/22/20 1854 Oral     SpO2 11/22/20 1854 99 %     Weight 11/22/20 1849 210 lb (95.3 kg)     Height 11/22/20 1849 5\' 6"  (1.676 m)     Head Circumference --      Peak Flow --      Pain Score 11/22/20 1849 7     Pain Loc --      Pain Edu? --      Excl. in [place]? --      Constitutional: Alert and oriented. Well appearing and in no acute distress. Eyes: Conjunctivae are normal. PERRL. EOMI. Head: Atraumatic. ENT:      Ears:       Nose: No congestion/rhinnorhea.      Mouth/Throat: Mucous membranes are moist.  Neck: No stridor.    Cardiovascular: Normal rate, regular rhythm. Normal S1 and  S2.  Good peripheral circulation. Respiratory: Normal respiratory effort without tachypnea or retractions. Lungs CTAB. Good air entry to the bases with no decreased or absent breath sounds. Musculoskeletal: Full range of motion to all extremities. No gross deformities appreciated.  Visualization of the right ankle reveals edema when compared with left.  Majority of edema lies along the anterolateral aspect of the ankle joint extending over the lateral malleolus.  Patient is nontender to palpation of the Achilles tendon with no palpable deficit.  No tenderness along the medial aspect.  Halfway across the anterior joint line, patient becomes tender with increasing edema to the area.  No palpable abnormalities about this region.  No crepitus.  Mild  extension around the talus with no extension into the metatarsals.  Good range of motion all digits.  Capillary refill less than 2 seconds all digits. Neurologic:  Normal speech and language. No gross focal neurologic deficits are appreciated.  Skin:  Skin is warm, dry and intact. No rash noted. Psychiatric: Mood and affect are normal. Speech and behavior are normal. Patient exhibits appropriate insight and judgement.   ____________________________________________   LABS (all labs ordered are listed, but only abnormal results are displayed)  Labs Reviewed - No data to display ____________________________________________  EKG   ____________________________________________  RADIOLOGY I personally viewed and evaluated these images as part of my medical decision making, as well as reviewing the written report by the radiologist.  ED Provider Interpretation: No acute osseous abnormality on xray. Specifically no fracture or dislocation  DG Ankle Complete Right  Result Date: 11/22/2020 CLINICAL DATA:  Right ankle injury EXAM: RIGHT ANKLE - COMPLETE 3+ VIEW COMPARISON:  None. FINDINGS: Mild lateral soft tissue swelling. No acute bony abnormality. Specifically, no fracture, subluxation, or dislocation. IMPRESSION: No acute bony abnormality. Electronically Signed   By: Charlett Nose M.D.   On: 11/22/2020 19:41   DG Foot Complete Right  Result Date: 11/22/2020 CLINICAL DATA:  Right ankle injury, lateral swelling EXAM: RIGHT FOOT COMPLETE - 3+ VIEW COMPARISON:  Ankle series today FINDINGS: Lateral soft tissue swelling over the ankle. No acute bony abnormality. Specifically, no fracture, subluxation, or dislocation. Joint spaces maintained. IMPRESSION: No acute bony abnormality. Electronically Signed   By: Charlett Nose M.D.   On: 11/22/2020 19:41    ____________________________________________    PROCEDURES  Procedure(s) performed:    Procedures    Medications  meloxicam (MOBIC) tablet  15 mg (has no administration in time range)     ____________________________________________   INITIAL IMPRESSION / ASSESSMENT AND PLAN / ED COURSE  Pertinent labs & imaging results that were available during my care of the patient were reviewed by me and considered in my medical decision making (see chart for details).  Review of the [place] CSRS was performed in accordance of the [facility] prior to dispensing any controlled drugs.           Patient's diagnosis is consistent with ankle sprain.  Patient presented to emergency department complaining of right ankle pain.  Patient states that she had injured her ankle while delivering packages earlier today.  She had swelling along the lateral malleolus.No gross deformity.  Patient was able to bear weight with difficulty.Imaging reveals no acute fracture or dislocation.  No widening of the ankle mortise joint.  Patient will be placed on crutches and given an ASO lace up stirrup ankle brace.  Meloxicam for symptom improvement.  Follow-up with orthopedics. Patient is given ED precautions to return to the ED for any worsening  or new symptoms.     ____________________________________________  FINAL CLINICAL IMPRESSION(S) / ED DIAGNOSES  Final diagnoses:  Sprain of right ankle, unspecified ligament, initial encounter      NEW MEDICATIONS STARTED DURING THIS VISIT:  ED Discharge Orders         Ordered    meloxicam (MOBIC) 15 MG tablet  Daily        11/22/20 2039              This chart was dictated using voice recognition software/Dragon. Despite best efforts to proofread, errors can occur which can change the meaning. Any change was purely unintentional.    Racheal Patches, PA-C 11/22/20 2046    Shaune Pollack, MD 11/26/20 2337

## 2020-11-22 NOTE — ED Triage Notes (Signed)
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

## 2021-03-24 ENCOUNTER — Emergency Department: Payer: BLUE CROSS/BLUE SHIELD

## 2021-03-24 ENCOUNTER — Other Ambulatory Visit: Payer: Self-pay

## 2021-03-24 DIAGNOSIS — Y92410 Unspecified street and highway as the place of occurrence of the external cause: Secondary | ICD-10-CM | POA: Insufficient documentation

## 2021-03-24 DIAGNOSIS — S20211A Contusion of right front wall of thorax, initial encounter: Secondary | ICD-10-CM | POA: Diagnosis not present

## 2021-03-24 DIAGNOSIS — F17210 Nicotine dependence, cigarettes, uncomplicated: Secondary | ICD-10-CM | POA: Diagnosis not present

## 2021-03-24 DIAGNOSIS — S4991XA Unspecified injury of right shoulder and upper arm, initial encounter: Secondary | ICD-10-CM | POA: Diagnosis present

## 2021-03-24 DIAGNOSIS — S40011A Contusion of right shoulder, initial encounter: Secondary | ICD-10-CM | POA: Insufficient documentation

## 2021-03-24 DIAGNOSIS — M542 Cervicalgia: Secondary | ICD-10-CM | POA: Insufficient documentation
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
## 2021-03-25 ENCOUNTER — Emergency Department
Admission: EM | Admit: 2021-03-25 | Discharge: 2021-03-25 | Disposition: A | Discharge status: Home / Self Care | Payer: BLUE CROSS/BLUE SHIELD | Attending: Emergency Medicine | Admitting: Emergency Medicine
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
## 2021-08-31 ENCOUNTER — Emergency Department
Admission: EM | Admit: 2021-08-31 | Discharge: 2021-09-01 | Disposition: A | Discharge status: Home / Self Care | Payer: Self-pay | Attending: Emergency Medicine | Admitting: Emergency Medicine

## 2021-08-31 ENCOUNTER — Other Ambulatory Visit: Payer: Self-pay

## 2021-08-31 DIAGNOSIS — R7401 Elevation of levels of liver transaminase levels: Secondary | ICD-10-CM

## 2021-08-31 DIAGNOSIS — K760 Fatty (change of) liver, not elsewhere classified: Secondary | ICD-10-CM | POA: Insufficient documentation

## 2021-08-31 DIAGNOSIS — K920 Hematemesis: Secondary | ICD-10-CM | POA: Insufficient documentation

## 2021-08-31 DIAGNOSIS — K209 Esophagitis, unspecified without bleeding: Secondary | ICD-10-CM | POA: Insufficient documentation

## 2021-08-31 LAB — LIPASE, BLOOD: Lipase: 56 U/L — ABNORMAL HIGH (ref 11–51)

## 2021-08-31 LAB — CBC
HCT: 47.1 % — ABNORMAL HIGH (ref 36.0–46.0)
Hemoglobin: 16 g/dL — ABNORMAL HIGH (ref 12.0–15.0)
MCH: 29.5 pg (ref 26.0–34.0)
MCHC: 34 g/dL (ref 30.0–36.0)
MCV: 86.7 fL (ref 80.0–100.0)
Platelets: 273 10*3/uL (ref 150–400)
RBC: 5.43 MIL/uL — ABNORMAL HIGH (ref 3.87–5.11)
RDW: 12.8 % (ref 11.5–15.5)
WBC: 12 10*3/uL — ABNORMAL HIGH (ref 4.0–10.5)
nRBC: 0 % (ref 0.0–0.2)

## 2021-08-31 LAB — COMPREHENSIVE METABOLIC PANEL
ALT: 126 U/L — ABNORMAL HIGH (ref 0–44)
AST: 189 U/L — ABNORMAL HIGH (ref 15–41)
Albumin: 4.5 g/dL (ref 3.5–5.0)
Alkaline Phosphatase: 58 U/L (ref 38–126)
Anion gap: 8 (ref 5–15)
BUN: 14 mg/dL (ref 6–20)
CO2: 22 mmol/L (ref 22–32)
Calcium: 9.1 mg/dL (ref 8.9–10.3)
Chloride: 106 mmol/L (ref 98–111)
Creatinine, Ser: 0.7 mg/dL (ref 0.44–1.00)
GFR, Estimated: >60 mL/min (ref >60)
Glucose, Bld: 115 mg/dL — ABNORMAL HIGH (ref 70–99)
Potassium: 3.9 mmol/L (ref 3.5–5.1)
Sodium: 136 mmol/L (ref 135–145)
Total Bilirubin: 1 mg/dL (ref 0.3–1.2)
Total Protein: 7.8 g/dL (ref 6.5–8.1)

## 2021-08-31 LAB — TYPE AND SCREEN
ABO/RH(D): O NEG
Antibody Screen: NEGATIVE

## 2021-08-31 NOTE — ED Provider Notes (Signed)
[facility] Provider Note    Event Date/Time   First MD Initiated Contact with Patient 08/31/21 2357     (approximate)   History   Hematemesis   HPI  Sonya Ferguson is a 26 y.o. female without significant past medical history including no history of regular EtOH use, NSAID use and 6-7 tablets of extra strength tylenol today but not before today and not regularly who presents for assessment of epigastric abdominal pain associated with fairly significant nausea and greater than 10 episodes of vomiting with coffee-ground material.  Patient has never had any GI bleeding before.  She states it started fairly suddenly.  She states she has some chills.  She denies any chest pain, cough, shortness of breath, headache, earache, sore throat, diarrhea, constipation, blood in her urine or stool, melanotic stools, recent injuries or falls or any other acute complaints.  No prior similar episodes.  No other acute concerns at this time.      Physical Exam  Triage Vital Signs: ED Triage Vitals  Enc Vitals Group     BP 08/31/21 2032 135/82     Pulse Rate 08/31/21 2032 96     Resp 08/31/21 2032 18     Temp 08/31/21 2032 (!) 97.3 F (36.3 C)     Temp Source 08/31/21 2032 Oral     SpO2 08/31/21 2032 100 %     Weight 08/31/21 2032 215 lb (97.5 kg)     Height 08/31/21 2032 5\' 6"  (1.676 m)     Head Circumference --      Peak Flow --      Pain Score 08/31/21 2035 3     Pain Loc --      Pain Edu? --      Excl. in GC? --     Most recent vital signs: Vitals:   08/31/21 2304 09/01/21 0222  BP: 131/78 131/89  Pulse: 65 78  Resp: 18 17  Temp: 97.6 F (36.4 C) 97.9 F (36.6 C)  SpO2: 100% 100%    General: Awake, appears mildly uncomfortable.  Hunched over container containing that appears to be red-tinged emesis with some black coffee-ground material. CV:  Good peripheral perfusion.  Slightly prolonged capillary refill.  2+ radial pulses.  No murmurs rubs or  gallops. Resp:  Normal effort.  Clear bilaterally. Abd:  No distention.  Tender in epigastrium and right upper quadrant.   ED Results / Procedures / Treatments  Labs (all labs ordered are listed, but only abnormal results are displayed) Labs Reviewed  COMPREHENSIVE METABOLIC PANEL - Abnormal; Notable for the following components:      Result Value   Glucose, Bld 115 (*)    AST 189 (*)    ALT 126 (*)    All other components within normal limits  CBC - Abnormal; Notable for the following components:   WBC 12.0 (*)    RBC 5.43 (*)    Hemoglobin 16.0 (*)    HCT 47.1 (*)    All other components within normal limits  LIPASE, BLOOD - Abnormal; Notable for the following components:   Lipase 56 (*)    All other components within normal limits  HCG, QUANTITATIVE, PREGNANCY  ACETAMINOPHEN LEVEL  HEPATITIS PANEL, ACUTE  POC OCCULT BLOOD, ED  POC URINE PREG, ED  TYPE AND SCREEN     EKG   RADIOLOGY  CTA abdomen pelvis ordered and reviewed by myself shows no active bleeding there is stigmata of esophagitis.  There  is also evidence of hepatic steatosis and enlarged gallbladder but no gallstones or other inflammatory changes to suggest acute cholecystitis.  No evidence of perforation, diverticulitis, appendicitis or any other acute abdominal or pelvic process.  No evidence of active bleeding.   PROCEDURES:  Critical Care performed: No  Procedures    MEDICATIONS ORDERED IN ED: Medications  pantoprozole (PROTONIX) 80 mg /NS 100 mL infusion (0 mg/hr Intravenous Stopped 09/01/21 0237)  pantoprazole (PROTONIX) injection 40 mg (has no administration in time range)  morphine 4 MG/ML injection 4 mg (4 mg Intravenous Not Given 09/01/21 0051)  pantoprazole (PROTONIX) 80 mg /NS 100 mL IVPB (0 mg Intravenous Stopped 09/01/21 0110)  ondansetron (ZOFRAN) injection 4 mg (4 mg Intravenous Given 09/01/21 0051)  lactated ringers bolus 1,000 mL (0 mLs Intravenous Stopped 09/01/21 0229)  iohexol  (OMNIPAQUE) 350 MG/ML injection 100 mL (100 mLs Intravenous Contrast Given 09/01/21 0123)  sucralfate (CARAFATE) 1 GM/10ML suspension 1 g (1 g Oral Given 09/01/21 0238)  ondansetron (ZOFRAN-ODT) disintegrating tablet 4 mg (4 mg Oral Given 09/01/21 0300)     IMPRESSION / MDM / ASSESSMENT AND PLAN / ED COURSE  I reviewed the triage vital signs and the nursing notes.                              Differential diagnosis includes, but is not limited to, gastritis, peptic ulcer disease, Mallory-Weiss tear, AVM versus other cause of upper GI bleeding.  Lower suspicion for lower GI bleed at this time.   CMP remarkable for transaminitis with an AST of 189 and ALT of 126.  He is a 56 not consistent with acute pancreatitis.  CBC shows WBC count of 12 with a hemoglobin of 16 and normal platelets.  Lipase 56.  Not consistent with acute pancreatitis.  hCG is negative.  Acetaminophen level is 13.  CTA abdomen pelvis ordered and reviewed by myself shows no active bleeding there is stigmata of esophagitis.  There is also evidence of hepatic steatosis and enlarged gallbladder but no gallstones or other inflammatory changes to suggest acute cholecystitis.  No evidence of perforation, diverticulitis, appendicitis or any other acute abdominal or pelvic process.  No evidence of active bleeding.  I suspect patient's mild transaminitis is from supratherapeutic dose of Tylenol taken earlier today.  She reports 7 to 8 tablets of extra strength Tylenol which would equate to possibly 4 g over a 12-hour period.  However this is well below the toxic threshold of 150 mg/kg which were patient given weight of 97.5 kg would be 14,625 mg.  Following Protonix and antiemetics patient states she is feeling much better.  She had no subsequent emesis and was able tolerate p.o. without difficulty.  Given stable vitals without any active bleeding over approximately 6 hours of observation emergency room and nausea much better controlled I  think she is stable for discharge with close outpatient GI follow-up.  Discussed importance of abstaining from Tylenol.  Rx written for Protonix and Zofran.  Discharged in stable condition.  Strict return precautions advised and discussed.  Hepatitis panel sent also out of abundance of caution given this could present this way although this can be followed up with GI.      FINAL CLINICAL IMPRESSION(S) / ED DIAGNOSES   Final diagnoses:  Hematemesis with nausea  Transaminitis  Esophagitis  Hepatic steatosis     Rx / DC Orders   ED Discharge Orders  Ordered    ondansetron (ZOFRAN) 4 MG tablet  Every 8 hours PRN        09/01/21 0230    pantoprazole (PROTONIX) 40 MG tablet  Daily        09/01/21 0230    sucralfate (CARAFATE) 1 GM/10ML suspension  4 times daily        09/01/21 0230             Note:  This document was prepared using Dragon voice recognition software and may include unintentional dictation errors.   Gilles Chiquito, MD 09/01/21 [phone]

## 2021-08-31 NOTE — ED Triage Notes (Signed)
Pt states she has been throwing up blood since 4pm- pt states she did not have any precursory symptoms- pt states it was dark, coffee ground appearance- pt states she has thrown up about 10 times since it started

## 2021-09-01 ENCOUNTER — Emergency Department: Payer: Self-pay

## 2021-09-01 LAB — HEPATITIS PANEL, ACUTE
HCV Ab: NONREACTIVE
Hep A IgM: NONREACTIVE
Hep B C IgM: NONREACTIVE
Hepatitis B Surface Ag: NONREACTIVE

## 2021-09-01 LAB — HCG, QUANTITATIVE, PREGNANCY: hCG, Beta Chain, Quant, S: <1 m[IU]/mL (ref <5)

## 2021-09-01 LAB — ACETAMINOPHEN LEVEL: Acetaminophen (Tylenol), Serum: 13 ug/mL (ref 10–30)

## 2021-09-01 MED ORDER — PANTOPRAZOLE SODIUM 40 MG IV SOLR: 40.0000 mg | Freq: Two times a day (BID) | INTRAVENOUS | Status: DC

## 2021-09-01 MED ORDER — ONDANSETRON 4 MG PO TBDP
4.0000 mg | ORAL_TABLET | Freq: Once | ORAL | Status: AC
  Administered 2021-09-01: 4 mg via ORAL
  Filled 2021-09-01: qty 1

## 2021-09-01 MED ORDER — LACTATED RINGERS IV BOLUS
1000.0000 mL | Freq: Once | INTRAVENOUS | Status: AC
  Administered 2021-09-01: 1000 mL via INTRAVENOUS

## 2021-09-01 MED ORDER — SUCRALFATE 1 GM/10ML PO SUSP
1.0000 g | ORAL | Status: AC
  Administered 2021-09-01: 1 g via ORAL
  Filled 2021-09-01: qty 10

## 2021-09-01 MED ORDER — SUCRALFATE 1 GM/10ML PO SUSP: 1.0000 g | Freq: Four times a day (QID) | ORAL | 0 refills | Status: DC

## 2021-09-01 MED ORDER — IOHEXOL 350 MG/ML SOLN
100.0000 mL | Freq: Once | INTRAVENOUS | Status: AC | PRN
  Administered 2021-09-01: 100 mL via INTRAVENOUS

## 2021-09-01 MED ORDER — PANTOPRAZOLE 80MG IVPB - SIMPLE MED
80.0000 mg | Freq: Once | INTRAVENOUS | Status: AC
  Administered 2021-09-01: 80 mg via INTRAVENOUS
  Filled 2021-09-01: qty 100

## 2021-09-01 MED ORDER — ONDANSETRON HCL 4 MG/2ML IJ SOLN
4.0000 mg | Freq: Once | INTRAMUSCULAR | Status: AC
  Administered 2021-09-01: 4 mg via INTRAVENOUS
  Filled 2021-09-01: qty 2

## 2021-09-01 MED ORDER — ONDANSETRON HCL 4 MG PO TABS: 4.0000 mg | ORAL_TABLET | Freq: Three times a day (TID) | ORAL | 0 refills | Status: DC | PRN

## 2021-09-01 MED ORDER — PANTOPRAZOLE SODIUM 40 MG PO TBEC: 40.0000 mg | DELAYED_RELEASE_TABLET | Freq: Every day | ORAL | 0 refills | Status: DC

## 2021-09-01 MED ORDER — MORPHINE SULFATE (PF) 4 MG/ML IV SOLN
4.0000 mg | Freq: Once | INTRAVENOUS | Status: DC
  Filled 2021-09-01: qty 1

## 2021-09-01 MED ORDER — PANTOPRAZOLE INFUSION (NEW) - SIMPLE MED
8.0000 mg/h | INTRAVENOUS | Status: DC
  Administered 2021-09-01: 8 mg/h via INTRAVENOUS
  Filled 2021-09-01: qty 100

## 2021-09-01 NOTE — Discharge Instructions (Addendum)
As we discussed, your gallbladder is slightly enlarged given you do not have tenderness in this area on my exam and there is no signs of inflammation around this area I have a low suspicion for cholecystitis at this time although he should be aware of this finding.  Below are your CT results.  Your CT today showed: FINDINGS: VASCULAR   Aorta: Normal caliber aorta without aneurysm, dissection, vasculitis or significant stenosis.   Celiac: Patent without evidence of aneurysm, dissection, vasculitis or significant stenosis.   SMA: Patent without evidence of aneurysm, dissection, vasculitis or significant stenosis.   Renals: Both renal arteries are patent without evidence of aneurysm, dissection, vasculitis, fibromuscular dysplasia or significant stenosis.   IMA: Patent without evidence of aneurysm, dissection, vasculitis or significant stenosis.   Inflow: Patent without evidence of aneurysm, dissection, vasculitis or significant stenosis.   Proximal Outflow: Bilateral common femoral and visualized portions of the superficial and profunda femoral arteries are patent without evidence of aneurysm, dissection, vasculitis or significant stenosis.   Veins: No obvious venous abnormality within the limitations of this arterial phase study.   Review of the MIP images confirms the above findings.   NON-VASCULAR   Lower chest: No acute abnormality.   Hepatobiliary: There is diffuse fatty infiltration of the liver parenchyma. No focal liver abnormality is seen. The gallbladder is markedly distended without gallstones, gallbladder wall thickening, or biliary dilatation.   Pancreas: Unremarkable. No pancreatic ductal dilatation or surrounding inflammatory changes.   Spleen: Normal in size without focal abnormality.   Adrenals/Urinary Tract: Adrenal glands are unremarkable. Kidneys are normal, without renal calculi, focal lesion, or hydronephrosis. Bladder is unremarkable.    Stomach/Bowel: Very mild thickening of the visualized portion of the distal esophagus is seen. Small paraesophageal calcifications are also present. Stomach is within normal limits. Appendix appears normal. No evidence of bowel wall thickening, distention, or inflammatory changes.   Lymphatic: No significant vascular findings are present. No enlarged abdominal or pelvic lymph nodes.   Reproductive: Uterus and bilateral adnexa are unremarkable.   Other: No abdominal wall hernia or abnormality. No abdominopelvic ascites.   Musculoskeletal: No acute or significant osseous findings.   IMPRESSION: 1. Findings which may represent mild distal esophagitis. 2. Hepatic steatosis. 3. Markedly distended gallbladder without gallstones, gallbladder wall thickening, or biliary dilatation. Correlation with right upper quadrant ultrasound is recommended if acute cholecystitis is of clinical concern. 4. No evidence of aneurysmal dilatation, dissection, major vessel occlusion or hemodynamically stenosis of the abdominal aorta and arterial structures of the abdomen and pelvis.

## 2021-09-01 NOTE — ED Notes (Signed)
ED Provider at bedside. 

## 2021-12-13 ENCOUNTER — Emergency Department
Admission: EM | Admit: 2021-12-13 | Discharge: 2021-12-13 | Disposition: A | Discharge status: Home / Self Care | Payer: 59 | Attending: Emergency Medicine | Admitting: Emergency Medicine

## 2021-12-13 ENCOUNTER — Other Ambulatory Visit: Payer: Self-pay

## 2021-12-13 ENCOUNTER — Emergency Department: Payer: 59

## 2021-12-13 DIAGNOSIS — R079 Chest pain, unspecified: Secondary | ICD-10-CM

## 2021-12-13 DIAGNOSIS — F17210 Nicotine dependence, cigarettes, uncomplicated: Secondary | ICD-10-CM | POA: Diagnosis not present

## 2021-12-13 DIAGNOSIS — R0789 Other chest pain: Secondary | ICD-10-CM | POA: Insufficient documentation

## 2021-12-13 DIAGNOSIS — R000 Tachycardia, unspecified: Secondary | ICD-10-CM | POA: Diagnosis not present

## 2021-12-13 LAB — BASIC METABOLIC PANEL
Anion gap: 7 (ref 5–15)
BUN: 13 mg/dL (ref 6–20)
CO2: 25 mmol/L (ref 22–32)
Calcium: 8.8 mg/dL — ABNORMAL LOW (ref 8.9–10.3)
Chloride: 106 mmol/L (ref 98–111)
Creatinine, Ser: 0.8 mg/dL (ref 0.44–1.00)
GFR, Estimated: >60 mL/min (ref >60)
Glucose, Bld: 93 mg/dL (ref 70–99)
Potassium: 3.7 mmol/L (ref 3.5–5.1)
Sodium: 138 mmol/L (ref 135–145)

## 2021-12-13 LAB — CBC
HCT: 42.2 % (ref 36.0–46.0)
Hemoglobin: 13.9 g/dL (ref 12.0–15.0)
MCH: 29 pg (ref 26.0–34.0)
MCHC: 32.9 g/dL (ref 30.0–36.0)
MCV: 87.9 fL (ref 80.0–100.0)
Platelets: 248 10*3/uL (ref 150–400)
RBC: 4.8 MIL/uL (ref 3.87–5.11)
RDW: 13 % (ref 11.5–15.5)
WBC: 9.3 10*3/uL (ref 4.0–10.5)
nRBC: 0 % (ref 0.0–0.2)

## 2021-12-13 LAB — TROPONIN I (HIGH SENSITIVITY)
Troponin I (High Sensitivity): 3 ng/L (ref <18)
Troponin I (High Sensitivity): 3 ng/L (ref <18)

## 2021-12-13 LAB — POC URINE PREG, ED: Preg Test, Ur: NEGATIVE

## 2021-12-13 NOTE — ED Notes (Signed)
Specimen collected from right [facility] and sent to lab. Patient tolerated procedure well. ?

## 2021-12-13 NOTE — ED Provider Notes (Signed)
? ?[facility] ?Provider Note ? ?Patient Contact: 8:48 PM (approximate) ? ? ?History  ? ?Chest Pain ? ? ?HPI ? ?Sonya Ferguson is a 26 y.o. female presents to the emergency department with intermittent chest heaviness and a sensation of heart racing that occurs intermittently.  Patient has had 1-2 episodes of emesis when her pain is particularly strong.  Patient denies a history of cardiac issues.  She is a daily smoker and uses both cigarettes and marijuana.  No recent travel, prolonged immobilization, recent surgery or prior history of DVT or PE.  No lower extremity swelling.  Denies persistent cough or other viral URI-like symptoms.  She has been afebrile at home.  States that she has had more stress in her life lately as she has been out of work due to multiple injuries.  She states that she noticed her symptoms after having a cortisone injection in her shoulder. ? ?  ? ? ?Physical Exam  ? ?Triage Vital Signs: ?ED Triage Vitals  ?Enc Vitals Group  ?   BP 12/13/21 1730 125/82  ?   Pulse Rate 12/13/21 1730 88  ?   Resp 12/13/21 1730 19  ?   Temp 12/13/21 1730 98.6 ?F (37 ?C)  ?   Temp Source 12/13/21 1730 Oral  ?   SpO2 12/13/21 1730 98 %  ?   Weight --   ?   Height 12/13/21 1731 5\' 6"  (1.676 m)  ?   Head Circumference --   ?   Peak Flow --   ?   Pain Score 12/13/21 1731 0  ?   Pain Loc --   ?   Pain Edu? --   ?   Excl. in GC? --   ? ? ?Most recent vital signs: ?Vitals:  ? 12/13/21 1730 12/13/21 2023  ?BP: 125/82 119/78  ?Pulse: 88 79  ?Resp: 19 16  ?Temp: 98.6 ?F (37 ?C) 98.7 ?F (37.1 ?C)  ?SpO2: 98% 100%  ? ? ? ?General: Alert and in no acute distress. ?Eyes:  PERRL. EOMI. ?Head: No acute traumatic findings ?ENT: ?     Ears:  ?     Nose: No congestion/rhinnorhea. ?     Mouth/Throat: Mucous membranes are moist. ?Neck: No stridor. No cervical spine tenderness to palpation. ?Cardiovascular:  Good peripheral perfusion ?Respiratory: Normal respiratory effort without tachypnea or retractions.  Lungs CTAB. Good air entry to the bases with no decreased or absent breath sounds. ?Gastrointestinal: Bowel sounds ?4 quadrants. Soft and nontender to palpation. No guarding or rigidity. No palpable masses. No distention. No CVA tenderness. ?Musculoskeletal: Full range of motion to all extremities.  ?Neurologic:  No gross focal neurologic deficits are appreciated.  ?Skin:   No rash noted ? ? ? ?ED Results / Procedures / Treatments  ? ?Labs ?(all labs ordered are listed, but only abnormal results are displayed) ?Labs Reviewed  ?BASIC METABOLIC PANEL - Abnormal; Notable for the following components:  ?    Result Value  ? Calcium 8.8 (*)   ? All other components within normal limits  ?CBC  ?POC URINE PREG, ED  ?TROPONIN I (HIGH SENSITIVITY)  ?TROPONIN I (HIGH SENSITIVITY)  ? ? ? ?EKG ? ?Normal sinus rhythm without ST segment elevation or other apparent arrhythmia. ? ? ?RADIOLOGY ? ?I personally viewed and evaluated these images as part of my medical decision making, as well as reviewing the written report by the radiologist. ? ?ED Provider Interpretation: I personally reviewed chest x-ray and there  were no consolidations, opacities or infiltrates to suggest pneumonia. ? ? ?PROCEDURES: ? ?Critical Care performed: No ? ?Procedures ? ? ?MEDICATIONS ORDERED IN ED: ?Medications - No data to display ? ? ?IMPRESSION / MDM / ASSESSMENT AND PLAN / ED COURSE  ?I reviewed the triage vital signs and the nursing notes. ?             ?               ? ?Assessment and plan: ?Chest pain:  ?Differential diagnosis includes, but is not limited to, STEMI, NSTEMI, pneumothorax, pneumonia ? ?26 year old female with an unremarkable past medical history presents to the emergency department with an intermittent sensation of chest heaviness and heart racing. ? ?Vital signs were reassuring at triage.  EKG indicated normal sinus rhythm without ST segment elevation or other apparent arrhythmia.  CBC, CMP and delta troponin within range.  No  consolidations, opacities or infiltrates on chest x-ray to suggest pneumonia.  No pneumothorax. ? ?Patient was cautioned to return to the emergency department if her symptoms seem to be worsening.  She voiced understanding and has easy access to the emergency department. ? ?  ? ? ?FINAL CLINICAL IMPRESSION(S) / ED DIAGNOSES  ? ?Final diagnoses:  ?Chest pain, unspecified type  ? ? ? ?Rx / DC Orders  ? ?ED Discharge Orders   ? ? None  ? ?  ? ? ? ?Note:  This document was prepared using Dragon voice recognition software and may include unintentional dictation errors. ?  ?Orvil Feil, PA-C ?12/13/21 2214 ? ?  ?Shaune Pollack, MD ?12/14/21 1526 ? ?

## 2021-12-13 NOTE — ED Triage Notes (Signed)
Patient seen at [facility] on Wednesday and received a steroid injection for bursitis in her right shoulder. Reports since she has been experiencing chest heaviness and shortness of breath. Describes feeling like her heart is beating fast at times. Denies any other symptoms.  ? ?Patient has never received a steroid shot before.  ?

## 2021-12-13 NOTE — ED Provider Triage Note (Signed)
?  Emergency Medicine Provider Triage Evaluation Note ? ?Sonya Ferguson , a 26 y.o.female,  was evaluated in triage.  Pt complains of chest pain, shortness of breath, and right shoulder pain ever since she received a steroid injection for her bursitis approximately 2 days ago.  In addition endorses palpitation and intermittent nausea/vomiting. ? ? ?Review of Systems  ?Positive: Chest pain, shortness of breath, nausea/vomiting, palpitations ?Negative: Denies fever, abdominal pain, urinary symptoms ? ?Physical Exam  ? ?Vitals:  ? 12/13/21 1730  ?BP: 125/82  ?Pulse: 88  ?Resp: 19  ?Temp: 98.6 ?F (37 ?C)  ?SpO2: 98%  ? ?Gen:   Awake, no distress   ?Resp:  Normal effort  ?MSK:   Moves extremities without difficulty  ?Other:   ? ?Medical Decision Making  ?Given the patient's initial medical screening exam, the following diagnostic evaluation has been ordered. The patient will be placed in the appropriate treatment space, once one is available, to complete the evaluation and treatment. I have discussed the plan of care with the patient and I have advised the patient that an ED physician or mid-level practitioner will reevaluate their condition after the test results have been received, as the results may give them additional insight into the type of treatment they may need.  ? ? ?Diagnostics: Labs, EKG, CXR ? ?Treatments: none immediately ?  ?Varney Daily, PA ?12/13/21 1739 ? ?

## 2022-02-03 ENCOUNTER — Other Ambulatory Visit: Payer: Self-pay | Admitting: Orthopedic Surgery

## 2022-02-05 ENCOUNTER — Encounter: Payer: Self-pay | Admitting: Orthopedic Surgery

## 2022-02-14 ENCOUNTER — Encounter
Admission: RE | Disposition: A | Discharge status: Home / Self Care | Payer: Self-pay | Source: Home / Self Care | Attending: Orthopedic Surgery

## 2022-02-14 ENCOUNTER — Ambulatory Visit: Payer: Worker's Compensation | Admitting: Anesthesiology

## 2022-02-14 ENCOUNTER — Encounter: Payer: Self-pay | Admitting: Orthopedic Surgery

## 2022-02-14 ENCOUNTER — Other Ambulatory Visit: Payer: Self-pay

## 2022-02-14 ENCOUNTER — Ambulatory Visit
Admission: RE | Admit: 2022-02-14 | Discharge: 2022-02-14 | Disposition: A | Discharge status: Home / Self Care | Payer: Worker's Compensation | Attending: Orthopedic Surgery | Admitting: Orthopedic Surgery

## 2022-02-14 DIAGNOSIS — K219 Gastro-esophageal reflux disease without esophagitis: Secondary | ICD-10-CM | POA: Insufficient documentation

## 2022-02-14 DIAGNOSIS — F17200 Nicotine dependence, unspecified, uncomplicated: Secondary | ICD-10-CM | POA: Insufficient documentation

## 2022-02-14 DIAGNOSIS — F1290 Cannabis use, unspecified, uncomplicated: Secondary | ICD-10-CM | POA: Diagnosis not present

## 2022-02-14 DIAGNOSIS — M89511 Osteolysis, right shoulder: Secondary | ICD-10-CM | POA: Insufficient documentation

## 2022-02-14 DIAGNOSIS — M7521 Bicipital tendinitis, right shoulder: Secondary | ICD-10-CM | POA: Diagnosis not present

## 2022-02-14 DIAGNOSIS — Z6834 Body mass index (BMI) 34.0-34.9, adult: Secondary | ICD-10-CM | POA: Insufficient documentation

## 2022-02-14 HISTORY — DX: Gastro-esophageal reflux disease without esophagitis: K21.9

## 2022-02-14 HISTORY — PX: SHOULDER ARTHROSCOPY: SHX128

## 2022-02-14 SURGERY — ARTHROSCOPY, SHOULDER
Anesthesia: Regional | Site: Shoulder | Laterality: Right

## 2022-02-14 MED ORDER — BUPIVACAINE HCL (PF) 0.5 % IJ SOLN
INTRAMUSCULAR | Status: DC | PRN
  Administered 2022-02-14 (×2): 10 mL

## 2022-02-14 MED ORDER — SCOPOLAMINE 1 MG/3DAYS TD PT72
1.0000 | MEDICATED_PATCH | TRANSDERMAL | Status: DC
  Administered 2022-02-14: 1.5 mg via TRANSDERMAL

## 2022-02-14 MED ORDER — RINGERS IRRIGATION IR SOLN
Status: DC | PRN
  Administered 2022-02-14: 3000 mL
  Administered 2022-02-14: 12000 mL

## 2022-02-14 MED ORDER — BUPIVACAINE LIPOSOME 1.3 % IJ SUSP
INTRAMUSCULAR | Status: DC | PRN
  Administered 2022-02-14 (×2): 10 mL

## 2022-02-14 MED ORDER — ACETAMINOPHEN 500 MG PO TABS: 1000.0000 mg | ORAL_TABLET | Freq: Three times a day (TID) | ORAL | 2 refills | Status: AC

## 2022-02-14 MED ORDER — OXYCODONE HCL 5 MG PO TABS: 5.0000 mg | ORAL_TABLET | Freq: Once | ORAL | Status: DC | PRN

## 2022-02-14 MED ORDER — ASPIRIN 325 MG PO TBEC: 325.0000 mg | DELAYED_RELEASE_TABLET | Freq: Every day | ORAL | 0 refills | Status: AC

## 2022-02-14 MED ORDER — OXYCODONE HCL 5 MG PO TABS: 5.0000 mg | ORAL_TABLET | ORAL | 0 refills | Status: AC | PRN

## 2022-02-14 MED ORDER — LACTATED RINGERS IV SOLN
INTRAVENOUS | Status: DC
Start: 2022-02-14 — End: 2022-02-14

## 2022-02-14 MED ORDER — ONDANSETRON HCL 4 MG/2ML IJ SOLN
INTRAMUSCULAR | Status: DC | PRN
  Administered 2022-02-14: 4 mg via INTRAVENOUS

## 2022-02-14 MED ORDER — DEXAMETHASONE SODIUM PHOSPHATE 4 MG/ML IJ SOLN
INTRAMUSCULAR | Status: DC | PRN
  Administered 2022-02-14: 8 mg via INTRAVENOUS

## 2022-02-14 MED ORDER — OXYCODONE HCL 5 MG/5ML PO SOLN: 5.0000 mg | Freq: Once | ORAL | Status: DC | PRN

## 2022-02-14 MED ORDER — BUPIVACAINE HCL (PF) 0.5 % IJ SOLN: INTRAMUSCULAR | Status: DC | PRN

## 2022-02-14 MED ORDER — FENTANYL CITRATE (PF) 100 MCG/2ML IJ SOLN
INTRAMUSCULAR | Status: DC | PRN
  Administered 2022-02-14: 100 ug via INTRAVENOUS

## 2022-02-14 MED ORDER — EPINEPHRINE PF 1 MG/ML IJ SOLN
INTRAMUSCULAR | Status: DC | PRN
  Administered 2022-02-14: 4 mg

## 2022-02-14 MED ORDER — MIDAZOLAM HCL 2 MG/2ML IJ SOLN
INTRAMUSCULAR | Status: DC | PRN
  Administered 2022-02-14: 2 mg via INTRAVENOUS

## 2022-02-14 MED ORDER — PROPOFOL 500 MG/50ML IV EMUL
INTRAVENOUS | Status: DC | PRN
  Administered 2022-02-14: 50 mg via INTRAVENOUS
  Administered 2022-02-14: 150 ug/kg/min via INTRAVENOUS

## 2022-02-14 MED ORDER — LIDOCAINE HCL (CARDIAC) PF 100 MG/5ML IV SOSY
PREFILLED_SYRINGE | INTRAVENOUS | Status: DC | PRN
  Administered 2022-02-14: 30 mg via INTRAVENOUS

## 2022-02-14 MED ORDER — CEFAZOLIN SODIUM-DEXTROSE 2-4 GM/100ML-% IV SOLN
2.0000 g | INTRAVENOUS | Status: AC
  Administered 2022-02-14: 2 g via INTRAVENOUS

## 2022-02-14 MED ORDER — ONDANSETRON HCL 4 MG PO TABS: 4.0000 mg | ORAL_TABLET | Freq: Three times a day (TID) | ORAL | 0 refills | Status: DC | PRN

## 2022-02-14 SURGICAL SUPPLY — 52 items
ADAPTER IRRIG TUBE 2 SPIKE SOL (ADAPTER) ×2 IMPLANT
ADH SKN CLS APL DERMABOND .7 (GAUZE/BANDAGES/DRESSINGS) ×1
ADPR TBG 2 SPK PMP STRL ASCP (ADAPTER) ×1
APL PRP STRL LF DISP 70% ISPRP (MISCELLANEOUS) ×3
BLADE SHAVER 4.5X7 STR FR (MISCELLANEOUS) ×2 IMPLANT
BUR BR 5.5 WIDE MOUTH (BURR) ×2 IMPLANT
CANNULA PART THRD DISP 5.75X7 (CANNULA) ×2 IMPLANT
CHLORAPREP W/TINT 26 (MISCELLANEOUS) ×4 IMPLANT
COOLER POLAR GLACIER W/PUMP (MISCELLANEOUS) ×2 IMPLANT
DERMABOND ADVANCED (GAUZE/BANDAGES/DRESSINGS) ×1
DERMABOND ADVANCED .7 DNX12 (GAUZE/BANDAGES/DRESSINGS) IMPLANT
DRAPE U 60X70 (DRAPES) ×4 IMPLANT
ELECT REM PT RETURN 9FT ADLT (ELECTROSURGICAL) ×2
ELECTRODE REM PT RTRN 9FT ADLT (ELECTROSURGICAL) ×1 IMPLANT
GAUZE SPONGE 4X4 12PLY STRL (GAUZE/BANDAGES/DRESSINGS) ×2 IMPLANT
GAUZE XEROFORM 1X8 LF (GAUZE/BANDAGES/DRESSINGS) ×2 IMPLANT
GLOVE SRG 8 PF TXTR STRL LF DI (GLOVE) ×1 IMPLANT
GLOVE SURG ENC MOIS LTX SZ7.5 (GLOVE) ×4 IMPLANT
GLOVE SURG ENC MOIS LTX SZ8 (GLOVE) ×2 IMPLANT
GLOVE SURG UNDER POLY LF SZ8 (GLOVE) ×2
GOWN STRL REUS W/ TWL LRG LVL3 (GOWN DISPOSABLE) ×1 IMPLANT
GOWN STRL REUS W/ TWL XL LVL3 (GOWN DISPOSABLE) ×1 IMPLANT
GOWN STRL REUS W/TWL LRG LVL3 (GOWN DISPOSABLE) ×2
GOWN STRL REUS W/TWL XL LVL3 (GOWN DISPOSABLE) ×2
IV LACTATED RINGER IRRG 3000ML (IV SOLUTION) ×10
IV LR IRRIG 3000ML ARTHROMATIC (IV SOLUTION) ×6 IMPLANT
KIT STABILIZATION SHOULDER (MISCELLANEOUS) ×2 IMPLANT
KIT TURNOVER KIT A (KITS) ×2 IMPLANT
MANIFOLD NEPTUNE II (INSTRUMENTS) ×3 IMPLANT
MASK FACE SPIDER DISP (MASK) ×2 IMPLANT
MAT ABSORB  FLUID 56X50 GRAY (MISCELLANEOUS) ×2
MAT ABSORB FLUID 56X50 GRAY (MISCELLANEOUS) ×1 IMPLANT
NDL SAFETY ECLIPSE 18X1.5 (NEEDLE) ×1 IMPLANT
NEEDLE HYPO 18GX1.5 SHARP (NEEDLE) ×2
PACK ARTHROSCOPY SHOULDER (MISCELLANEOUS) ×2 IMPLANT
PAD ABD DERMACEA PRESS 5X9 (GAUZE/BANDAGES/DRESSINGS) ×1 IMPLANT
PAD WRAPON POLAR SHDR XLG (MISCELLANEOUS) ×1 IMPLANT
SPONGE T-LAP 18X18 ~~LOC~~+RFID (SPONGE) ×2 IMPLANT
SUT ETHILON 3-0 FS-10 30 BLK (SUTURE) ×2
SUT MNCRL 4-0 (SUTURE) ×2
SUT MNCRL 4-0 27XMFL (SUTURE) ×1
SUT VIC AB 3-0 SH 27 (SUTURE) ×2
SUT VIC AB 3-0 SH 27X BRD (SUTURE) IMPLANT
SUTURE EHLN 3-0 FS-10 30 BLK (SUTURE) IMPLANT
SUTURE MNCRL 4-0 27XMF (SUTURE) IMPLANT
SYR 5ML LL (SYRINGE) ×2 IMPLANT
SYSTEM FBRTK BICEPS 1.9 DRILL (Anchor) ×1 IMPLANT
TAPE MICROFOAM 4IN (TAPE) ×2 IMPLANT
TUBING INFLOW SET DBFLO PUMP (TUBING) ×2 IMPLANT
TUBING OUTFLOW SET DBLFO PUMP (TUBING) ×2 IMPLANT
WAND WEREWOLF FLOW 90D (MISCELLANEOUS) ×2 IMPLANT
WRAPON POLAR PAD SHDR XLG (MISCELLANEOUS) ×2

## 2022-02-14 NOTE — Transfer of Care (Signed)
Immediate Anesthesia Transfer of Care Note  Patient: Sonya Ferguson  Procedure(s) Performed: Shoulder arthroscopy, distal clavicle excision, and open subpectoral biceps tenodesis (Right: Shoulder)  Patient Location: PACU  Anesthesia Type: General, Regional  Level of Consciousness: awake, alert  and patient cooperative  Airway and Oxygen Therapy: Patient Spontanous Breathing and Patient connected to supplemental oxygen  Post-op Assessment: Post-op Vital signs reviewed, Patient's Cardiovascular Status Stable, Respiratory Function Stable, Patent Airway and No signs of Nausea or vomiting  Post-op Vital Signs: Reviewed and stable  Complications: No notable events documented.

## 2022-02-14 NOTE — Anesthesia Procedure Notes (Signed)
Anesthesia Regional Block: Interscalene brachial plexus block   Pre-Anesthetic Checklist: , timeout performed,  Correct Patient, Correct Site, Correct Laterality,  Correct Procedure, Correct Position, site marked,  Risks and benefits discussed,  Surgical consent,  Pre-op evaluation,  At surgeon's request and post-op pain management  Laterality: Right  Prep: chloraprep       Needles:  Injection technique: Single-shot  Needle Type: Stimiplex     Needle Length: 9cm  Needle Gauge: 20     Additional Needles: No h/p.  Procedures:,,,, ultrasound used (permanent image in chart),,    Narrative:  Start time: 02/14/2022 7:00 AM End time: 02/14/2022 7:05 AM Injection made incrementally with aspirations every 5 mL.  Performed by: Personally  Anesthesiologist: Orrin Brigham, MD  Additional Notes: Functioning IV was confirmed and monitors applied. Ultrasound guidance: relevant anatomy identified, needle position confirmed, local anesthetic spread visualized around nerve(s)., vascular puncture avoided.  Image printed for medical record.  Negative aspiration and no paresthesias; incremental administration of local anesthetic. The patient tolerated the procedure well. Vitals signes recorded in RN notes.

## 2022-02-14 NOTE — Anesthesia Preprocedure Evaluation (Signed)
Anesthesia Evaluation  Patient identified by MRN, date of birth, ID band Patient awake    Reviewed: NPO status   History of Anesthesia Complications Negative for: history of anesthetic complications  Airway Mallampati: II  TM Distance: >3 FB Neck ROM: full    Dental no notable dental hx. (+) Poor Dentition,    Pulmonary Current SmokerPatient did not abstain from smoking.,    Pulmonary exam normal        Cardiovascular Exercise Tolerance: Good negative cardio ROS Normal cardiovascular exam     Neuro/Psych negative neurological ROS  negative psych ROS   GI/Hepatic GERD  Controlled,(+)     substance abuse  marijuana use,   Endo/Other  Morbid obesity (bmi 34)  Renal/GU negative Renal ROS  negative genitourinary   Musculoskeletal   Abdominal   Peds  Hematology negative hematology ROS (+)   Anesthesia Other Findings   Reproductive/Obstetrics negative OB ROS                             Anesthesia Physical Anesthesia Plan  ASA: 2  Anesthesia Plan: General and Regional   Post-op Pain Management: Regional block   Induction:   PONV Risk Score and Plan: 3 and Midazolam, Scopolamine patch - Pre-op and Ondansetron  Airway Management Planned:   Additional Equipment:   Intra-op Plan:   Post-operative Plan:   Informed Consent: I have reviewed the patients History and Physical, chart, labs and discussed the procedure including the risks, benefits and alternatives for the proposed anesthesia with the patient or authorized representative who has indicated his/her understanding and acceptance.       Plan Discussed with: CRNA  Anesthesia Plan Comments:         Anesthesia Quick Evaluation

## 2022-02-14 NOTE — Discharge Instructions (Signed)
Post-Op Instructions - Arthroscopic Shoulder Surgery with Biceps Tenodesis  1. Bracing: You will wear a shoulder immobilizer or sling for 4 weeks.   2. Driving: When driving, do not wear the immobilizer. Ideally, we recommend no driving for 4 weeks while sling is in place as one arm will be immobilized.   3. Activity: No active lifting for 6 weeks. Wrist, hand, and elbow motion only. You are permitted to bend and straighten the elbow passively only (no active elbow motion). You may use your hand and wrist for typing, writing, and managing utensils (cutting food). Do not lift more than a coffee cup for 8 weeks.  When sleeping or resting, inclined positions (recliner chair or wedge pillow) and a pillow under the forearm for support may provide better comfort for up to 4 weeks.  Avoid long distance travel for 4 weeks.  Return to normal activities normally takes 4 months on average. If rehab goes very well, may be able to do most activities at 3 months, except overhead or contact sports.  4. Physical Therapy: Begins 3-5 days after surgery, and proceed 1 time per week for the first 6 weeks, then 1-2 times per week from weeks 6-20 post-op.  5. Medications:  - You will be provided a prescription for narcotic pain medicine. After surgery, take 1-2 narcotic tablets every 4 hours if needed for severe pain.  - A prescription for anti-nausea medication will be provided in case the narcotic medicine causes nausea - take 1 tablet every 6 hours only if nauseated.   - Take tylenol 1000 mg (2 Extra Strength tablets or 3 regular strength) every 8 hours for pain.  May decrease or stop tylenol 5 days after surgery if you are having minimal pain. - Take ASA 325mg /day x 2 weeks to help prevent DVTs/PEs (blood clots).    If you are taking prescription medication for anxiety, depression, insomnia, muscle spasm, chronic pain, or for attention deficit disorder, you are advised that you are at a higher risk of adverse  effects with use of narcotics post-op, including narcotic addiction/dependence, depressed breathing, death. If you use non-prescribed substances: alcohol, marijuana, cocaine, heroin, methamphetamines, etc., you are at a higher risk of adverse effects with use of narcotics post-op, including narcotic addiction/dependence, depressed breathing, death. You are advised that taking > 50 morphine milligram equivalents (MME) of narcotic pain medication per day results in twice the risk of overdose or death. For your prescription provided: oxycodone 5 mg - taking more than 6 tablets per day would result in > 50 morphine milligram equivalents (MME) of narcotic pain medication. Be advised that we will prescribe narcotics short-term, for acute post-operative pain only - 3 weeks for major operations such as shoulder repair/reconstruction surgeries.    6. Post-Op Appointment:  Your first post-op appointment will be 10-14 days post-op.  7. Work or School: For most, but not all procedures, we advise staying out of work or school for at least 1 to 2 weeks in order to recover from the stress of surgery and to allow time for healing.   If you need a work or school note this can be provided.   8. Smoking: If you are a smoker, you need to refrain from smoking in the postoperative period. The nicotine in cigarettes will inhibit healing of your shoulder repair and decrease the chance of successful repair. Similarly, nicotine containing products (gum, patches) should be avoided.   Post-operative Brace: Apply and remove the brace you received as you were instructed  to at the time of fitting and as described in detail as the brace's instructions for use indicate.  Wear the brace for the period of time prescribed by your physician.  The brace can be cleaned with soap and water and allowed to air dry only.  Should the brace result in increased pain, decreased feeling (numbness/tingling), increased swelling or an overall  worsening of your medical condition, please contact your doctor immediately.  If an emergency situation occurs as a result of wearing the brace after normal business hours, please dial 911 and seek immediate medical attention.  Let your doctor know if you have any further questions about the brace issued to you. Refer to the shoulder sling instructions for use if you have any questions regarding the correct fit of your shoulder sling.  [facility]: [phone]  Video that illustrates how to properly use a shoulder sling: "Instructions for Proper Use of an Orthopaedic Sling" [email or web address]

## 2022-02-14 NOTE — Anesthesia Postprocedure Evaluation (Signed)
Anesthesia Post Note  Patient: Sonya Ferguson  Procedure(s) Performed: Shoulder arthroscopy, distal clavicle excision, and open subpectoral biceps tenodesis (Right: Shoulder)     Patient location during evaluation: PACU Anesthesia Type: Regional Level of consciousness: awake and alert Pain management: pain level controlled Vital Signs Assessment: post-procedure vital signs reviewed and stable Respiratory status: spontaneous breathing, nonlabored ventilation, respiratory function stable and patient connected to nasal cannula oxygen Cardiovascular status: blood pressure returned to baseline and stable Postop Assessment: no apparent nausea or vomiting Anesthetic complications: no   No notable events documented.  Orrin Brigham

## 2022-02-14 NOTE — H&P (Signed)
Paper H&P to be scanned into permanent record. H&P reviewed. No significant changes noted.  

## 2022-02-14 NOTE — Op Note (Signed)
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 Right acromioclavicular joint osteolysis 2. Right biceps tendinopathy  PROCEDURES:  1. Right open subpectoral biceps tenodesis 2. Right arthroscopic distal clavicle excision 3. Right extensive debridement of shoulder (glenohumeral and subacromial spaces)  SURGEON: Cato Mulligan, MD  ASSISTANT: Sophia Hasham, PA-S   ANESTHESIA: MAC with Exparel interscalene block  ESTIMATED BLOOD LOSS: 10cc  DRAINS:  none  TOTAL IV FLUIDS: per anesthesia   SPECIMENS: none  IMPLANTS:   Arthrex - Double loaded FiberTak Suture Anchor - x1  OPERATIVE FINDINGS:  Examination under anesthesia: A careful examination under anesthesia was performed.  Passive range of motion was: FF: 160; ER at side: 45; ER in abduction: 90; IR in abduction: 45.  Anterior load shift: NT.  Posterior load shift: NT.  Sulcus in neutral: NT.  Sulcus in ER: NT.    Intra-operative findings: A thorough arthroscopic examination of the shoulder was performed.  The findings are: 1. Biceps tendon: tendinopathy with yellowish degenerative appearing tendon 2. Superior labrum: Type II SLAP tear 3. Posterior labrum and capsule: normal 4. Inferior capsule and inferior recess: normal 5. Glenoid cartilage surface: normal 6. Supraspinatus attachment: normal 7. Posterior rotator cuff attachment: normal 8. Humeral head articular cartilage: normal 9. Rotator interval: significant synovitis; cord-like MGHL associated with buford complex with attachment on superior labrum 10: Subscapularis tendon: attachment intact 11. Anterior labrum: mildly degenerative tissue 12. IGHL: normal  OPERATIVE REPORT:   Indications for procedure: Sonya Ferguson is a 26 y.o. female with over 64-monthhistory of shoulder pain after sustaining an injury while at work.  She was carrying a box over her right shoulder and  felt a pop as she moved it.  This is a [employer]  She has done extensive physical therapy and attempted medical management as well as corticosteroid injection without significant long-term improvement in her symptoms.  She has been limited to light duty work since this time.  Clinical exam and MRI was concerning for biceps tendon pathology/SLAP tear and AC joint osteolysis. After discussion of risks, benefits, and alternatives to surgery, the patient elected to proceed.    Procedure in detail:  I identified the patient in the pre-operative holding area. I marked the operative shoulder with my initials. I reviewed the risks and benefits of the proposed surgical intervention, and the patient (and/or patient's guardian) wished to proceed.  Anesthesia was then performed with an interscalene block with Exparel.  The patient was transferred to the operative suite and placed in the beach chair position.    SCDs were placed on the lower extremities. Appropriate IV antibiotics were administered prior to incision. The operative upper extremity was then prepped and draped in standard fashion. A time out was performed confirming the correct extremity, correct patient, and correct procedure.   I then created a standard posterior portal with an 11 blade. The glenohumeral joint was easily entered with a blunt trochar and the arthroscope introduced. The findings of diagnostic arthroscopy are described above. I debrided degenerative tissue including the synovitic tissue about the rotator interval and the degenerative portion of the anterior labrum. I then coagulated the inflamed synovium to obtain hemostasis and reduce the risk of post-operative swelling using an Arthrocare radiofrequency device. I performed a biceps tenotomy using an arthroscopic scissors and used a motorized shaver to debride the stump back to a stable base.  At this point, the cordlike MGHL/Buford complex was probed.  Attachment was  noted to the superior labrum.  The arm was taken through range of motion and there was pulling of the superior labrum.  Given the presence of a type II SLAP tear, the cordlike MGHL was then debrided and excised.  Next, the arthroscope was then introduced into the subacromial space. A direct lateral portal was created with an 11-blade after spinal needle localization. An extensive subacromial bursectomy and debridement was performed using a combination of the shaver and Arthrocare wand.   I then turned my attention to the arthroscopic distal clavicle excision. I identified the acromioclavicular joint. Surrounding bursal tissue was debrided and the edges of the joint were identified. I used the 5.17m barrel burr to remove the distal clavicle parallel to the edge of the acromion. I was able to fit two widths of the burr into the space between the distal clavicle and acromion, signifying that I had removed ~190mof distal clavicle.  This was confirmed using a probe to measure the space between the distal clavicle and acromion.  Hemostasis was achieved with an Arthrocare wand.  This concluded the arthroscopic portion of the procedure.  I then directed my attention to the open subpectoral biceps tenodesis. I made an ~5cm incision parallel to the skin relaxation lines along the medial upper arm, such that 1/3 of the incision was above the pectoralis major tendon and 2/3 below it. Sharp dissection was carried down to the fascia overlying the pectoralis and the short head of the biceps. Bovie electrocautery was used to achieve hemostasis. I made a vertical nick in the fascia in line with the short head of the biceps. The pectoralis tendon was then retracted with a large Hohmann retractor. An [employer] retractor was used to retract inferiorly. This afforded excellent visualization of the long head of the biceps tendon. It was delivered out of the incision. I then placed a double-loaded Arthrex FiberTak all-suture anchor  at the most proximal and central portion of the inter-tubercular groove that I could visualize. I took one set of tapes and with the use of a free needle, passed one strand through the biceps tendon once and passed the other strand through the biceps in a locking fashion. Suture tapes were passed through the biceps tendon starting ~1cm away from the the musculocutaneous junction. This was also done for the other tape as well. This configuration allowed me to then parachute the tendon back into the wound as the two ends of the suture tape were tied together with a Surgeon's knot. This was repeated for the other strand of suture tape. The excess proximal tendon was sharply excised. The tension in the tenodesed long head was excellent.   The wound was thoroughly irrigated with saline, and the incision closed in layers using 3-0 Vicryl for the deep dermis and a running subcuticular 4-0 Monocryl for skin. A thin layer of Dermabond was applied. The portals were closed with 3-0 Nylon. Xeroform was applied to the portals. A sterile dressing was applied, followed by a Polar Care sleeve and a SlingShot shoulder immobilizer/sling. The patient was awakened from anesthesia without difficulty and was transferred to the PACU in stable condition.     COMPLICATIONS: none  DISPOSITION: plan for discharge home after recovery in PACU   POSTOPERATIVE PLAN: Remain in sling (except hygiene and elbow/wrist/hand RoM exercises as instructed by PT) x 4 weeks and NWB for this time. PT to begin 3-4 days after surgery. Biceps tenodesis + distal clavicle excision rehab protocol.

## 2022-02-14 NOTE — Anesthesia Procedure Notes (Signed)
Procedure Name: General with mask airway Date/Time: 02/14/2022 7:48 AM  Performed by: Jinny Blossom, CRNAPre-anesthesia Checklist: Patient identified, Emergency Drugs available, Suction available, Patient being monitored and Timeout performed Patient Re-evaluated:Patient Re-evaluated prior to induction Oxygen Delivery Method: Simple face mask Preoxygenation: Pre-oxygenation with 100% oxygen Induction Type: IV induction

## 2022-02-17 ENCOUNTER — Encounter: Payer: Self-pay | Admitting: Orthopedic Surgery

## 2022-02-26 LAB — POCT PREGNANCY, URINE: Preg Test, Ur: NEGATIVE

## 2022-10-10 IMAGING — CR DG CHEST 2V
2 series · 2 of 2 positions shown · non-contrast
Comparison: None.

CLINICAL DATA: Chest heaviness and shortness of breath after recent
steroid injection for bursitis in the right shoulder.

EXAM:
CHEST - 2 VIEW

[chest pa]
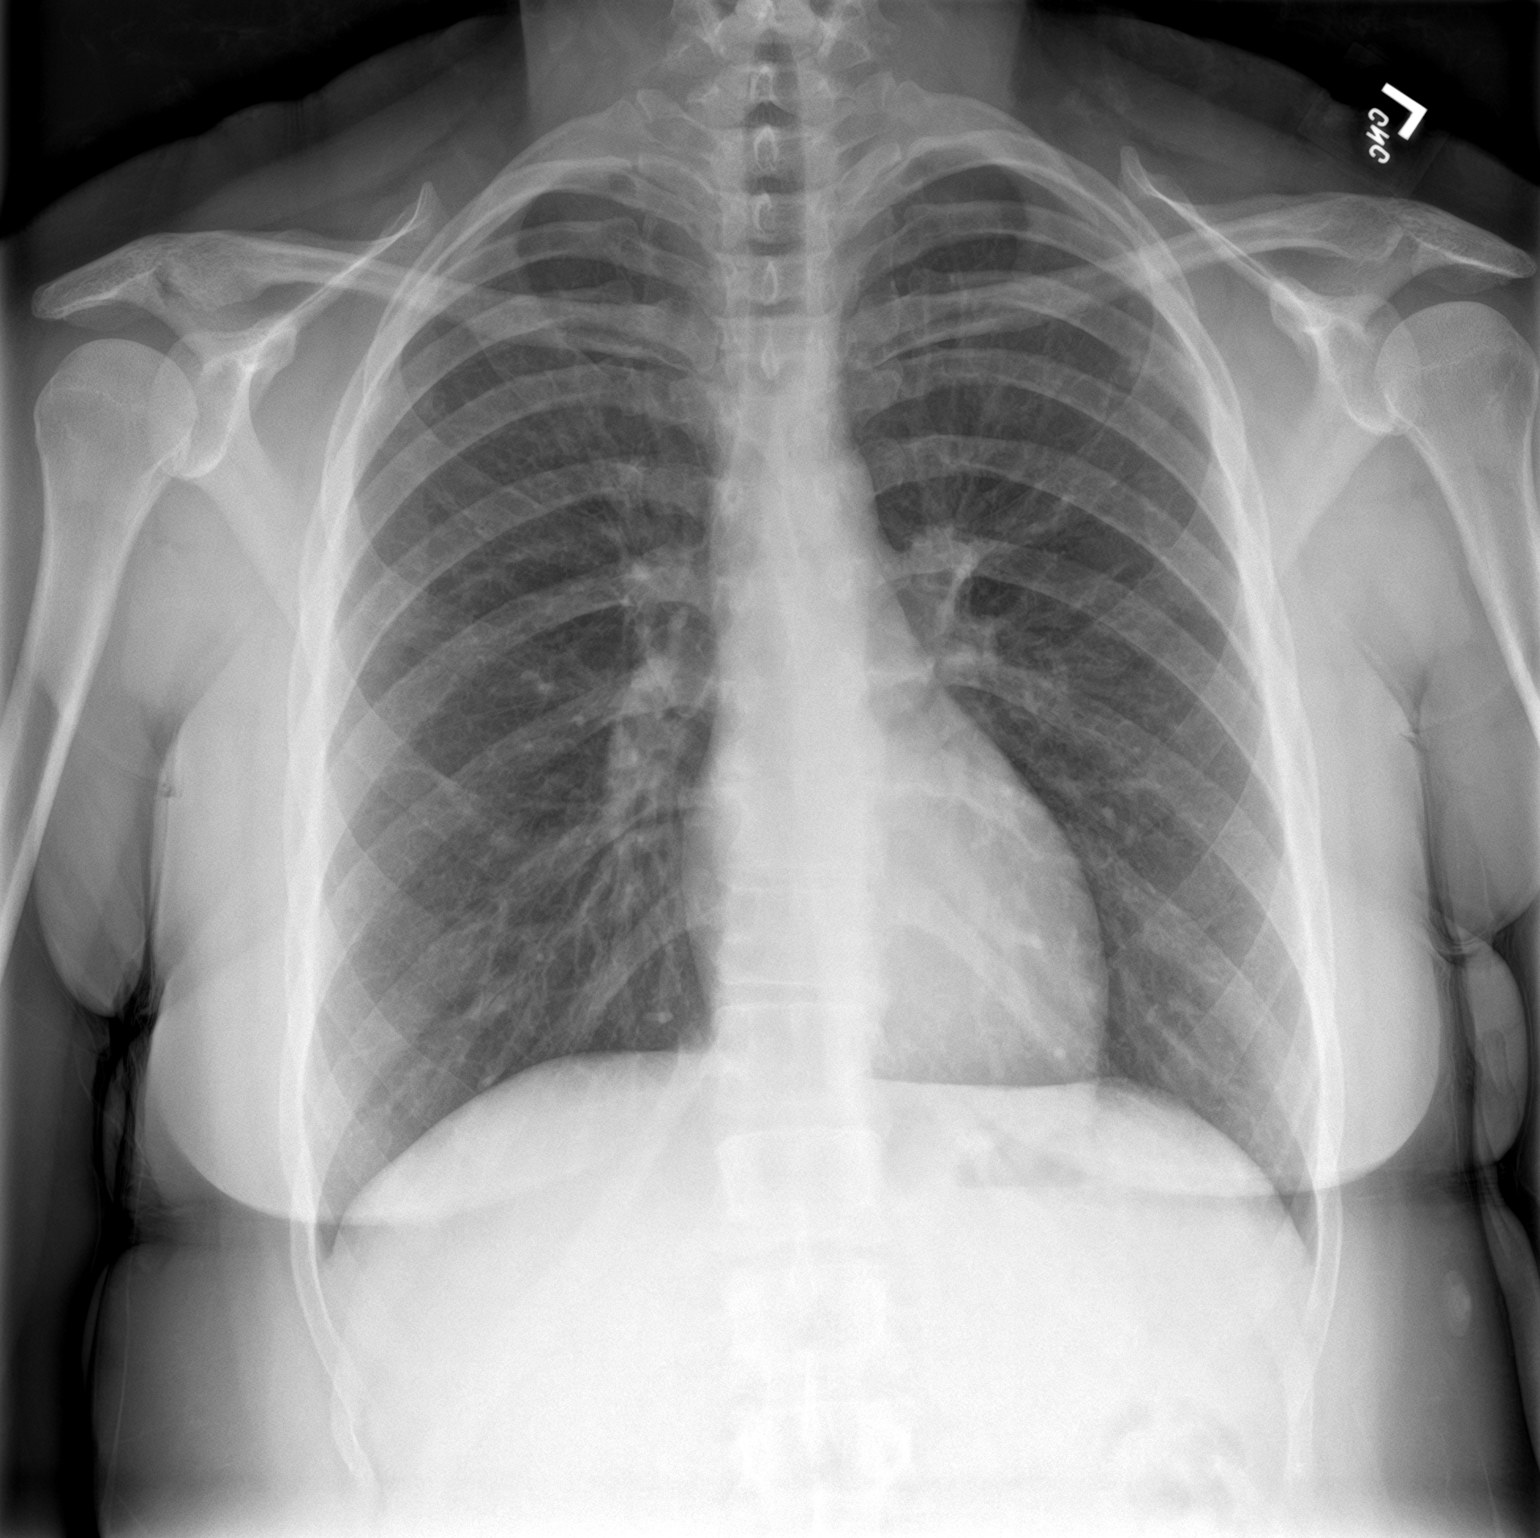

[chest lat]
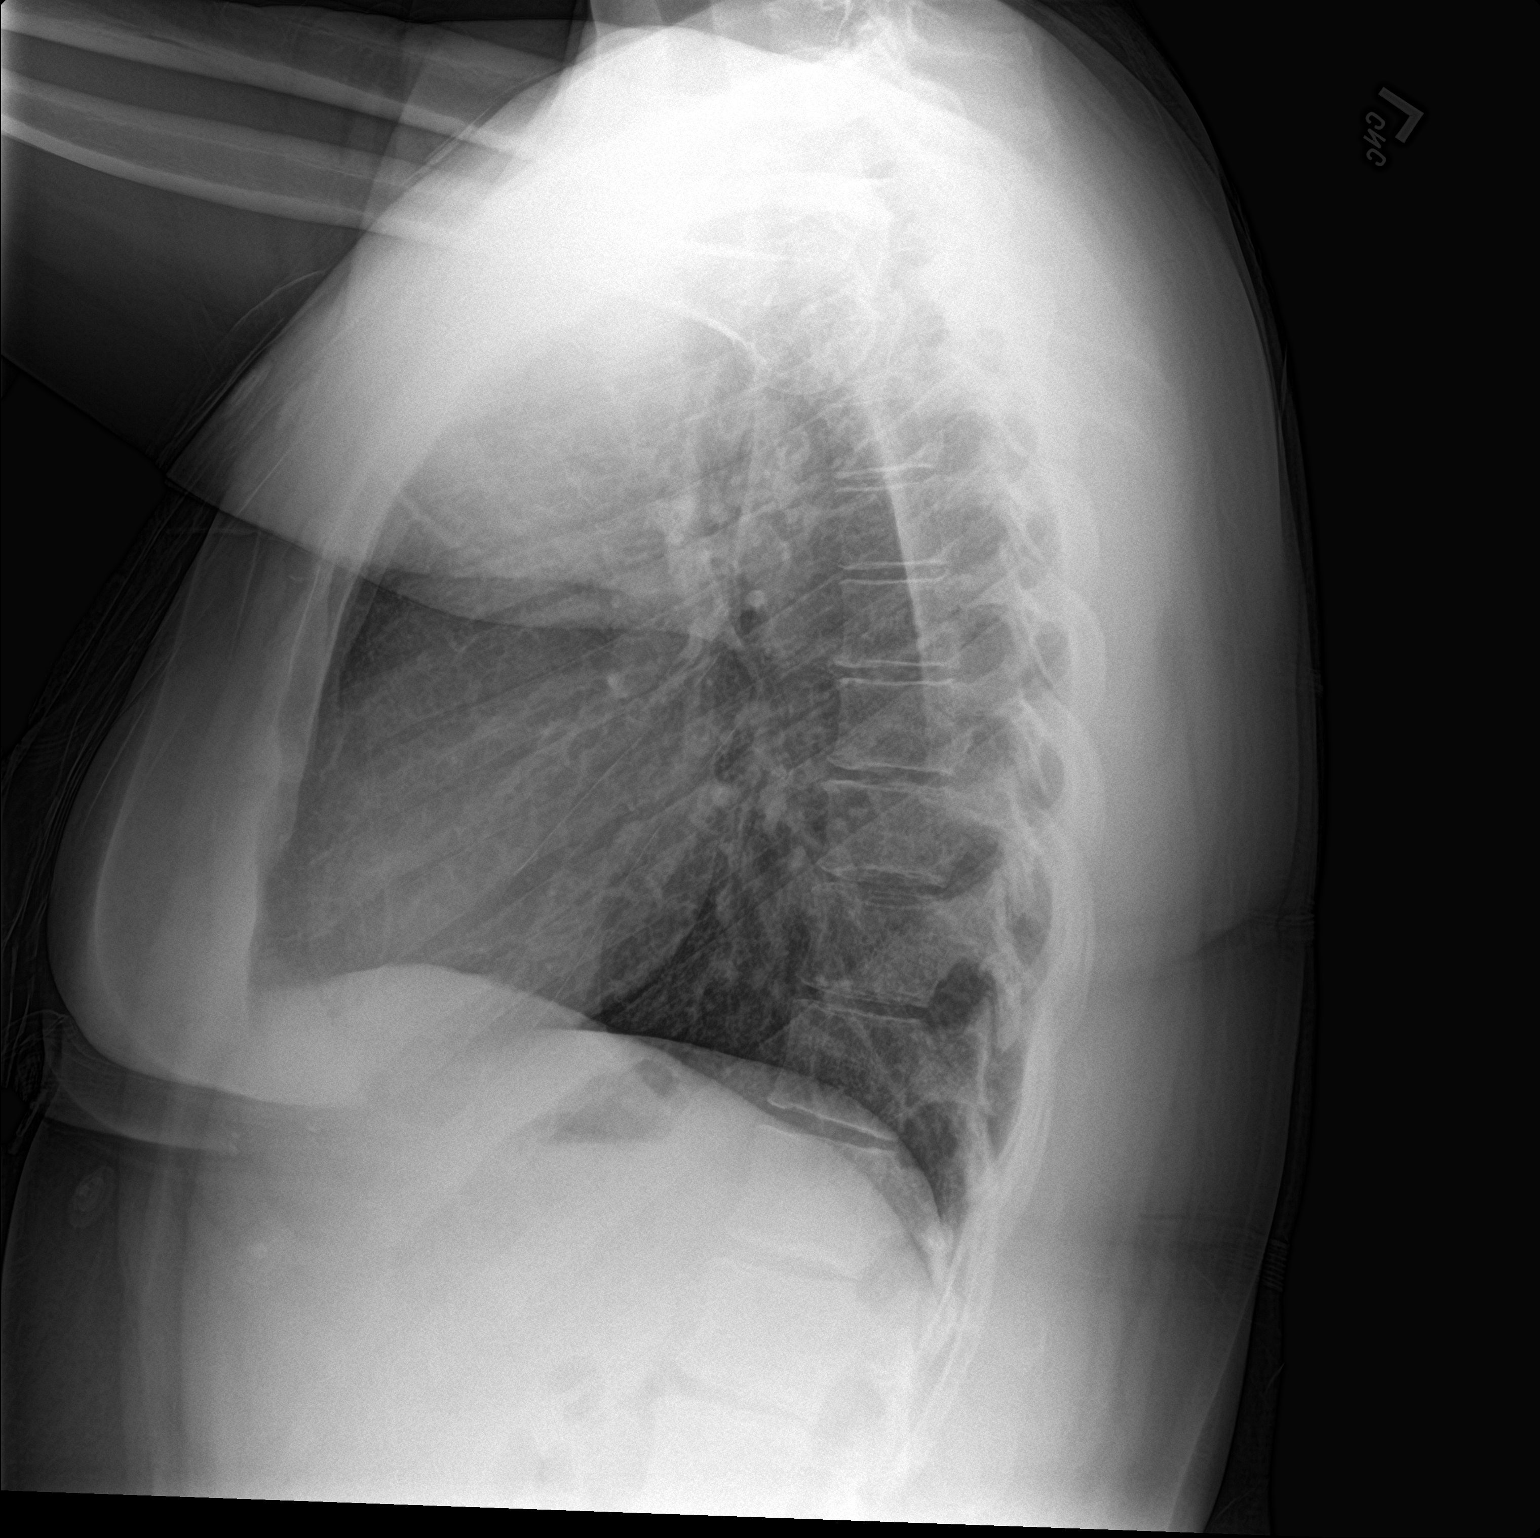

[2 of 2 positions shown; findings below may reference images not displayed]

FINDINGS: The heart size and mediastinal contours are within normal limits.
Both lungs are clear. The visualized skeletal structures are
unremarkable.
IMPRESSION: No active cardiopulmonary disease.

## 2023-12-18 DIAGNOSIS — R102 Pelvic and perineal pain: Secondary | ICD-10-CM | POA: Diagnosis not present

## 2023-12-18 DIAGNOSIS — O26891 Other specified pregnancy related conditions, first trimester: Secondary | ICD-10-CM | POA: Diagnosis not present

## 2023-12-18 DIAGNOSIS — Z3A08 8 weeks gestation of pregnancy: Secondary | ICD-10-CM | POA: Diagnosis not present

## 2023-12-25 ENCOUNTER — Encounter: Payer: Self-pay | Admitting: *Deleted

## 2023-12-25 ENCOUNTER — Emergency Department
Admission: EM | Admit: 2023-12-25 | Discharge: 2023-12-25 | Disposition: A | Discharge status: Home / Self Care | Attending: Emergency Medicine | Admitting: Emergency Medicine

## 2023-12-25 ENCOUNTER — Other Ambulatory Visit: Payer: Self-pay

## 2023-12-25 DIAGNOSIS — O219 Vomiting of pregnancy, unspecified: Secondary | ICD-10-CM | POA: Diagnosis not present

## 2023-12-25 DIAGNOSIS — O211 Hyperemesis gravidarum with metabolic disturbance: Secondary | ICD-10-CM | POA: Diagnosis present

## 2023-12-25 DIAGNOSIS — O210 Mild hyperemesis gravidarum: Secondary | ICD-10-CM

## 2023-12-25 DIAGNOSIS — Z3A08 8 weeks gestation of pregnancy: Secondary | ICD-10-CM | POA: Diagnosis not present

## 2023-12-25 LAB — URINALYSIS, ROUTINE W REFLEX MICROSCOPIC
Bilirubin Urine: NEGATIVE
Glucose, UA: NEGATIVE mg/dL
Ketones, ur: NEGATIVE mg/dL
Leukocytes,Ua: NEGATIVE
Nitrite: NEGATIVE
Protein, ur: NEGATIVE mg/dL
Specific Gravity, Urine: 1.026 (ref 1.005–1.030)
pH: 6 (ref 5.0–8.0)

## 2023-12-25 LAB — CBC
HCT: 40.1 % (ref 36.0–46.0)
Hemoglobin: 13.6 g/dL (ref 12.0–15.0)
MCH: 29.1 pg (ref 26.0–34.0)
MCHC: 33.9 g/dL (ref 30.0–36.0)
MCV: 85.9 fL (ref 80.0–100.0)
Platelets: 264 10*3/uL (ref 150–400)
RBC: 4.67 MIL/uL (ref 3.87–5.11)
RDW: 14 % (ref 11.5–15.5)
WBC: 11 10*3/uL — ABNORMAL HIGH (ref 4.0–10.5)
nRBC: 0 % (ref 0.0–0.2)

## 2023-12-25 LAB — BASIC METABOLIC PANEL WITH GFR
Anion gap: 9 (ref 5–15)
BUN: 10 mg/dL (ref 6–20)
CO2: 19 mmol/L — ABNORMAL LOW (ref 22–32)
Calcium: 9.1 mg/dL (ref 8.9–10.3)
Chloride: 106 mmol/L (ref 98–111)
Creatinine, Ser: 0.58 mg/dL (ref 0.44–1.00)
GFR, Estimated: >60 mL/min (ref >60)
Glucose, Bld: 105 mg/dL — ABNORMAL HIGH (ref 70–99)
Potassium: 3.6 mmol/L (ref 3.5–5.1)
Sodium: 134 mmol/L — ABNORMAL LOW (ref 135–145)

## 2023-12-25 LAB — HCG, QUANTITATIVE, PREGNANCY: hCG, Beta Chain, Quant, S: 147408 m[IU]/mL — ABNORMAL HIGH (ref <5)

## 2023-12-25 MED ORDER — FAMOTIDINE IN NACL 20-0.9 MG/50ML-% IV SOLN
20.0000 mg | Freq: Once | INTRAVENOUS | Status: AC
Start: 2023-12-25 — End: 2023-12-25
  Administered 2023-12-25: 20 mg via INTRAVENOUS
  Filled 2023-12-25: qty 50

## 2023-12-25 MED ORDER — ONDANSETRON 4 MG PO TBDP: 4.0000 mg | ORAL_TABLET | Freq: Three times a day (TID) | ORAL | 0 refills | Status: DC | PRN

## 2023-12-25 MED ORDER — LACTATED RINGERS IV BOLUS
1000.0000 mL | Freq: Once | INTRAVENOUS | Status: AC
  Administered 2023-12-25: 1000 mL via INTRAVENOUS

## 2023-12-25 MED ORDER — ONDANSETRON HCL 4 MG/2ML IJ SOLN
4.0000 mg | Freq: Once | INTRAMUSCULAR | Status: AC
  Administered 2023-12-25: 4 mg via INTRAVENOUS
  Filled 2023-12-25: qty 2

## 2023-12-25 NOTE — ED Provider Notes (Signed)
 [facility] Emergency Department Provider Note     Event Date/Time   First MD Initiated Contact with Patient 12/25/23 [phone]     (approximate)   History   Emesis During Pregnancy   HPI  Sonya Ferguson is a 28 y.o. female G1P0 presents to the ED for evaluation of nausea and vomiting in pregnancy.  Patient is approximately [redacted] weeks pregnant and has had 3 weeks of intermittent symptoms.  She has not establish care with a primary OB provider at this time yet she felt as if there was some coffee-ground emesis and blood in her vomitus patient denies any weakness or dizziness patient denies any pregnancy related complaints including cramping, abdominal pain, or vaginal discharge or bleeding.   Physical Exam   Triage Vital Signs: ED Triage Vitals  Encounter Vitals Group     BP 12/25/23 0926 (!) 127/97     Systolic BP Percentile --      Diastolic BP Percentile --      Pulse Rate 12/25/23 0926 94     Resp 12/25/23 0926 16     Temp 12/25/23 0926 98.7 F (37.1 C)     Temp Source 12/25/23 0926 Oral     SpO2 12/25/23 0926 100 %     Weight 12/25/23 0938 212 lb 1.3 oz (96.2 kg)     Height 12/25/23 0938 5\' 6"  (1.676 m)     Head Circumference --      Peak Flow --      Pain Score 12/25/23 0927 0     Pain Loc --      Pain Education --      Exclude from Growth Chart --     Most recent vital signs: Vitals:   12/25/23 0926  BP: (!) 127/97  Pulse: 94  Resp: 16  Temp: 98.7 F (37.1 C)  SpO2: 100%    General Awake, no distress. NAD HEENT NCAT. PERRL. EOMI. No rhinorrhea. Mucous membranes are moist.  CV:  Good peripheral perfusion. RRR RESP:  Normal effort.  ABD:  No distention.  GYN:  deferred   ED Results / Procedures / Treatments   Labs (all labs ordered are listed, but only abnormal results are displayed) Labs Reviewed  CBC - Abnormal; Notable for the following components:      Result Value   WBC 11.0 (*)    All other components within normal  limits  BASIC METABOLIC PANEL WITH GFR - Abnormal; Notable for the following components:   Sodium 134 (*)    CO2 19 (*)    Glucose, Bld 105 (*)    All other components within normal limits  URINALYSIS, ROUTINE W REFLEX MICROSCOPIC - Abnormal; Notable for the following components:   Color, Urine YELLOW (*)    APPearance HAZY (*)    Hgb urine dipstick MODERATE (*)    Bacteria, UA RARE (*)    All other components within normal limits  HCG, QUANTITATIVE, PREGNANCY - Abnormal; Notable for the following components:   hCG, Beta Chain, Quant, S 147,408 (*)    All other components within normal limits     EKG   RADIOLOGY   No results found.   PROCEDURES:  Critical Care performed: No  Procedures   MEDICATIONS ORDERED IN ED: Medications  lactated ringers  bolus 1,000 mL (0 mLs Intravenous Stopped 12/25/23 1214)  ondansetron  (ZOFRAN ) injection 4 mg (4 mg Intravenous Given 12/25/23 1001)  famotidine (PEPCID) IVPB 20 mg premix (0 mg Intravenous Stopped 12/25/23  1022)     IMPRESSION / MDM / ASSESSMENT AND PLAN / ED COURSE  I reviewed the triage vital signs and the nursing notes.                              Differential diagnosis includes, but is not limited to, hyperemesis gravidarum, threatened miscarriage, incomplete miscarriage, normal bleeding from an early trimester pregnancy, ectopic pregnancy, blighted ovum, vaginal/cervical trauma, subchorionic hemorrhage/hematoma, etc.   Patient's presentation is most consistent with acute presentation with potential threat to life or bodily function.  Patient's diagnosis is consistent with hyperemesis.  Gravid patient with reassuring Fama workup at this time.  Patient with no acute lab normalities.  No evidence of critical anemia, leukocytosis, or dehydration.  Symptoms are managed with IV fluid bolus as well as IV antiemetics and H2 blockers.  Patient with no pregnancy related complaints at this time, with a reassuring beta quant.  Patient  will be discharged home with prescriptions for ondansetron . Patient is to follow up with OB clinic as discussed, as needed or otherwise directed. Patient is given ED precautions to return to the ED for any worsening or new symptoms.     FINAL CLINICAL IMPRESSION(S) / ED DIAGNOSES   Final diagnoses:  Hyperemesis affecting pregnancy, antepartum     Rx / DC Orders   ED Discharge Orders          Ordered    ondansetron  (ZOFRAN -ODT) 4 MG disintegrating tablet  Every 8 hours PRN        12/25/23 1158             Note:  This document was prepared using Dragon voice recognition software and may include unintentional dictation errors.    May Sparks, PA-C 12/29/23 1513    Lind Repine, MD 12/30/23 [phone]

## 2023-12-25 NOTE — Discharge Instructions (Addendum)
 Your exam and labs are normal and reassuring at this time.  Continue to hydrate and eat small meals every 2 hours as tolerated.  Take the prescription nausea medicine as prescribed.  Select and follow-up with one of the local OB clinics for routine prenatal care.  Return to the ED as needed.

## 2023-12-25 NOTE — ED Triage Notes (Signed)
 Pt is here due to nausea and vomiting for the past 3 weeks.  Pt states that she is [redacted] weeks pregnant.  She states that this am she vomited and she felt that there was blood in it (like coffee gounds and red).  No weakness or dizziness.  Ambulatory to triage. LMP 10/25/2023

## 2023-12-25 NOTE — ED Notes (Signed)
 Pt is approximately [redacted] weeks pregnant, states that she has been vomiting 3-4 times in the morning and occasionally in the afternoon, pt states that she is new to all of this and is uncertain as to what to expect or do, pt reports that she was approved for her medicaid yesterday and is waiting to be assigned a dr

## 2024-01-14 DIAGNOSIS — Z113 Encounter for screening for infections with a predominantly sexual mode of transmission: Secondary | ICD-10-CM | POA: Diagnosis not present

## 2024-01-14 DIAGNOSIS — O219 Vomiting of pregnancy, unspecified: Secondary | ICD-10-CM | POA: Diagnosis not present

## 2024-01-14 DIAGNOSIS — Z124 Encounter for screening for malignant neoplasm of cervix: Secondary | ICD-10-CM | POA: Diagnosis not present

## 2024-01-14 DIAGNOSIS — O99210 Obesity complicating pregnancy, unspecified trimester: Secondary | ICD-10-CM | POA: Diagnosis not present

## 2024-01-14 DIAGNOSIS — Z131 Encounter for screening for diabetes mellitus: Secondary | ICD-10-CM | POA: Diagnosis not present

## 2024-01-14 LAB — OB RESULTS CONSOLE VARICELLA ZOSTER ANTIBODY, IGG: Varicella: NON-IMMUNE/NOT IMMUNE

## 2024-01-14 LAB — OB RESULTS CONSOLE RPR: RPR: NONREACTIVE

## 2024-01-14 LAB — OB RESULTS CONSOLE RUBELLA ANTIBODY, IGM: Rubella: IMMUNE

## 2024-01-14 LAB — OB RESULTS CONSOLE HIV ANTIBODY (ROUTINE TESTING): HIV: NONREACTIVE

## 2024-01-14 LAB — OB RESULTS CONSOLE HEPATITIS B SURFACE ANTIGEN: Hepatitis B Surface Ag: NEGATIVE

## 2024-01-29 DIAGNOSIS — O99210 Obesity complicating pregnancy, unspecified trimester: Secondary | ICD-10-CM | POA: Diagnosis not present

## 2024-02-22 ENCOUNTER — Other Ambulatory Visit: Payer: Self-pay | Admitting: Obstetrics and Gynecology

## 2024-02-22 DIAGNOSIS — O1202 Gestational edema, second trimester: Secondary | ICD-10-CM

## 2024-02-23 ENCOUNTER — Ambulatory Visit: Payer: Self-pay | Admitting: Obstetrics and Gynecology

## 2024-02-23 ENCOUNTER — Ambulatory Visit
Admission: RE | Admit: 2024-02-23 | Discharge: 2024-02-23 | Disposition: A | Discharge status: Home / Self Care | Source: Ambulatory Visit | Attending: Obstetrics and Gynecology | Admitting: Obstetrics and Gynecology

## 2024-02-23 DIAGNOSIS — O1202 Gestational edema, second trimester: Secondary | ICD-10-CM | POA: Diagnosis present

## 2024-02-23 DIAGNOSIS — R600 Localized edema: Secondary | ICD-10-CM | POA: Diagnosis not present

## 2024-03-14 DIAGNOSIS — O0992 Supervision of high risk pregnancy, unspecified, second trimester: Secondary | ICD-10-CM | POA: Diagnosis not present

## 2024-03-17 ENCOUNTER — Telehealth: Payer: Self-pay | Admitting: Licensed Clinical Social Worker

## 2024-03-17 NOTE — Telephone Encounter (Signed)
-----   Message from Nurse Calton ORN sent at 03/17/2024  1:39 PM EDT ----- Sonya Ferguson this patient is interested in Counseling services. The case manager working with her is [employer]. Please update her with any concerns.   Calton Roys

## 2024-03-31 ENCOUNTER — Ambulatory Visit: Payer: Self-pay | Admitting: Licensed Clinical Social Worker

## 2024-03-31 DIAGNOSIS — F411 Generalized anxiety disorder: Secondary | ICD-10-CM

## 2024-03-31 DIAGNOSIS — F339 Major depressive disorder, recurrent, unspecified: Secondary | ICD-10-CM
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
## 2024-04-02 DIAGNOSIS — F339 Major depressive disorder, recurrent, unspecified: Secondary | ICD-10-CM | POA: Insufficient documentation

## 2024-04-02 DIAGNOSIS — F411 Generalized anxiety disorder: Secondary | ICD-10-CM | POA: Insufficient documentation

## 2024-04-07 ENCOUNTER — Ambulatory Visit: Admitting: Licensed Clinical Social Worker

## 2024-04-07 NOTE — Progress Notes (Unsigned)
 Counselor/Therapist Progress Note  Patient ID: Sonya Ferguson, MRN: [id number],    Date: 04/07/2024  Time Spent: ## total minutes. I spent ## minutes face to face with the patient on the date of service. I spent an additional ##  minutes on pre- and post-visit activities on the date of service including collateral, chart review, team discussion, and documentation.     Treatment Type: Individual Therapy  Reported Symptoms: {CHL AMB Reported Symptoms:[phone]}  Mental Status Exam:  Appearance:   {PSY:22683}     Behavior:  {PSY:21022743}  Motor:  {PSY:22302}  Speech/Language:   {PSY:22685}  Affect:  {PSY:22687}  Mood:  {PSY:31886}  Thought process:  {PSY:31888}  Thought content:    {PSY:512 260 2625}  Sensory/Perceptual disturbances:    {PSY:[id number]}  Orientation:  {PSY:30297}  Attention:  {PSY:22877}  Concentration:  {PSY:7094819153}  Memory:  {PSY:7125259718}  Fund of knowledge:   {PSY:7094819153}  Insight:    {PSY:7094819153}  Judgment:   {PSY:7094819153}  Impulse Control:  {PSY:7094819153}   Risk Assessment: Danger to Self:  {PSY:22692} Self-injurious Behavior: {PSY:22692} Danger to Others: {PSY:22692} Duty to Warn:{PSY:311194} Physical Aggression / Violence:{PSY:21197} Access to Firearms a concern: {PSY:21197} Gang Involvement:{PSY:21197}  Subjective: Patient reports    Interventions: Cognitive Behavioral Therapy and Client Centered  Checked in with patient, conducted brief assessment of current symptoms, psychosocial stressors, and assessed safety. Set session agenda collaboratively with patient. Reviewed previous session-assessment.  Provided psychoeducation on integrated treatment modalities, including CBTs and somatics. Explored patient's goal of treatment, continued to build rapport and understanding, and worked collaboratively to develop treatment plan. Provided support through active listening, validation of feelings, and highlighted patient's strengths.    Diagnosis:   ICD-10-CM   1. Generalized anxiety disorder  F41.1     2. Depression, unspecified depression type  F33.9      Plan: Patient's goal of treatment    Future Appointments  Date Time Provider Department Center  04/07/2024  1:00 PM Ellender Palma, LCSW [facility] None    [place], [place]

## 2024-04-13 DIAGNOSIS — M778 Other enthesopathies, not elsewhere classified: Secondary | ICD-10-CM | POA: Diagnosis not present

## 2024-04-24 ENCOUNTER — Telehealth: Payer: Self-pay | Admitting: Licensed Clinical Social Worker

## 2024-04-24 NOTE — Telephone Encounter (Signed)
 04/22/24 msg from clerical that patient would like to schedule an appointment.

## 2024-04-25 DIAGNOSIS — F411 Generalized anxiety disorder: Secondary | ICD-10-CM | POA: Diagnosis not present

## 2024-04-25 DIAGNOSIS — F331 Major depressive disorder, recurrent, moderate: Secondary | ICD-10-CM | POA: Diagnosis not present

## 2024-04-27 DIAGNOSIS — L52 Erythema nodosum: Secondary | ICD-10-CM | POA: Diagnosis not present

## 2024-04-27 DIAGNOSIS — R21 Rash and other nonspecific skin eruption: Secondary | ICD-10-CM | POA: Diagnosis not present

## 2024-04-28 ENCOUNTER — Ambulatory Visit: Admitting: Licensed Clinical Social Worker

## 2024-04-28 DIAGNOSIS — F411 Generalized anxiety disorder: Secondary | ICD-10-CM | POA: Diagnosis not present

## 2024-04-28 DIAGNOSIS — F339 Major depressive disorder, recurrent, unspecified: Secondary | ICD-10-CM

## 2024-04-28 NOTE — Progress Notes (Signed)
 Counselor/Therapist Progress Note  Patient ID: Sonya Ferguson, MRN: [id number],    Date: 04/28/2024  Time Spent: 66 total minutes. I spent 56 minutes face to face with the patient on the date of service. I spent an additional 10 minutes on pre- and post-visit activities on the date of service including collateral, chart review, team discussion, and documentation.   Treatment Type: Individual Therapy  Reported Symptoms: Static symptoms  Mental Status Exam:  Appearance:   Casual     Behavior:  Appropriate, Sharing, and Motivated  Motor:  Psychomotor Retardation  Speech/Language:   Clear and Coherent and Normal Rate  Affect:  Appropriate and Congruent  Mood:  normal and okay  Thought process:  normal and logical  Thought content:    WNL  Sensory/Perceptual disturbances:    WNL  Orientation:  oriented to person, place, time/date, situation, and day of week  Attention:  Good  Concentration:  Good  Memory:  WNL  Fund of knowledge:   Good  Insight:    Fair  Judgment:   Fair  Impulse Control:  Fair   Risk Assessment: Danger to Self:  No Self-injurious Behavior: No Danger to Others: No Duty to Warn:no Physical Aggression / Violence:No  Access to Firearms a concern: No  Gang Involvement:No   Subjective: Patient reports that her mood has been "okay," though she has been feeling somewhat irritable which she notes that was due to three days without taking Zoloft but has since resumed the medication. Anxiety remains about the same, with continued worries, frequent "what if" thoughts, and anxiety related to going places.  Patient was engaged, cooperative, and receptive to feedback and interventions. She expressed agreement with the current treatment plan.  Interventions: Cognitive Behavioral Therapy, Mindfulness Meditation, and Client Centered Checked in with the patient and conducted a brief assessment of current symptoms, psychosocial stressors, and safety. Collaboratively established  the session agenda and reviewed content from the previous session, including assessment findings. LCSW introduced and reviewed the therapeutic modalities being utilized, including an integrated CBT approach drawing from CBT, ACT, DBT, and Mindfulness, as well as Somatic Processing techniques. Provided psychoeducation on the connection between thoughts, emotions, behaviors, and physical sensations. Explored patient's treatment goals, continued to build rapport, and worked collaboratively to develop a treatment plan. Provided psychoeducation on mindfulness and guided the patient through a mindfulness exercise, followed by processing of the experience. Patient agreed to engage in daily mindfulness practice as part of treatment.  Diagnosis:   ICD-10-CM   1. Generalized anxiety disorder  F41.1     2. Depression, unspecified depression type  F33.9      Plan: Patient's goal of therapy is to not think so anxious. Wants to be able to go where she needs to go and it not be so stressful. And wants to not always be thinking and preparing for the worst, decrease what if's.    Decrease the frequency and intensity of anxiety symptoms. Objectives: Patient will identify and track anxiety symptoms weekly using a self-reported scale (0-10). Patient will demonstrate use of at least 4 coping strategies--including cognitive defusion, mindfulness, grounding, and somatic resourcing--during moments of heightened anxiety. Patient will practice daily mindfulness, body-based grounding, and cognitive defusion exercises, and report impact on anxiety symptoms in session. Patient will identify and track at least 2 body sensations associated with anxiety and 2 associated with calm or safety over the course of treatment. Interventions: Teach and reinforce CBT techniques to identify and challenge cognitive distortions. Introduce ACT-based cognitive defusion strategies (e.g., "  leaves on a stream," "thank your mind"). Guide  patient in somatic resourcing (e.g., identifying felt sense of safety, orienting to environment, resourcing with imagery). Engage patient in interoceptive awareness exercises to increase recognition of body sensations related to anxiety and regulation. Assign and process daily mindfulness and defusion practices, including somatic check-ins. Use grounding and DBT distress tolerance techniques as needed.  Increase ability to engage in daily activities and go to necessary places with reduced anticipatory anxiety. Objectives: Patient will identify specific situations or places avoided due to anxiety. Patient will create and implement an exposure hierarchy for feared situations. Patient will report a 50% reduction in avoidance behavior by the end of 12 months. Interventions: Use ACT strategies to identify values and commit to behavior aligned with those values. Use CBT exposure techniques to gradually increase tolerance for feared situations. Use mindfulness and grounding techniques before and after exposure activities. Explore somatic responses during exposure and provide tools for nervous system regulation.  Future Appointments  Date Time Provider Department Center  05/04/2024  2:00 PM Ellender Palma, LCSW [facility] None    [place], [place]

## 2024-05-04 ENCOUNTER — Ambulatory Visit: Admitting: Licensed Clinical Social Worker

## 2024-05-04 NOTE — Progress Notes (Unsigned)
 Counselor/Therapist Progress Note  Patient ID: Sonya Sonya, MRN: [id number],    Date: 05/04/2024  Time Spent: ## total minutes. I spent ## minutes face to face with the patient on the date of service. I spent an additional 5 minutes on pre- and post-visit activities on the date of service including collateral, chart review, team discussion, and documentation.   Treatment Type: Individual Therapy  Reported Symptoms: {CHL AMB Reported Symptoms:[id number]}  Mental Status Exam:  Appearance:   {PSY:22683}     Behavior:  {PSY:21022743}  Motor:  {PSY:22302}  Speech/Language:   {PSY:22685}  Affect:  {PSY:22687}  Mood:  {PSY:31886}  Thought process:  {PSY:31888}  Thought content:    {PSY:[phone]}  Sensory/Perceptual disturbances:    {[phone]}  Orientation:  {PSY:30297}  Attention:  {PSY:22877}  Concentration:  {PSY:825-464-3706}  Memory:  {PSY:8720626238}  Fund of knowledge:   {PSY:825-464-3706}  Insight:    {PSY:825-464-3706}  Judgment:   {PSY:825-464-3706}  Impulse Control:  {PSY:825-464-3706}   Risk Assessment: Danger to Self:  {PSY:22692} Self-injurious Behavior: {PSY:22692} Danger to Others: {PSY:22692} Duty to Warn:{PSY:311194} Physical Aggression / Violence:{PSY:21197} Access to Firearms a concern: {PSY:21197} Gang Involvement:{PSY:21197}  Subjective: Patient reports    Interventions: {PSY:[phone]} LCSW met with patient to identify needs related to stressors and functioning, and assess and monitor for signs and symptoms of and , and assess safety. Checked in with patient regarding how they have been doing since the last follow-up session.   Met with patient to assess current needs related to stressors and daily functioning. Monitored for symptoms of and assessed for safety. Conducted a follow-up check-in on the patient's well-being since the last session.   LCSW assisted patient in processing their thoughts and emotions regarding ***   LCSW provided psychoeducation  and therapeutic intervention regarding coping with ***   Provided support through active listening, validation of feelings, and highlighted patient's strengths.  Engaged patient in processing current psychosocial stressors  LCSW intervened with positive regard and optimism to validate client's emotions, and supported client in exploring ways to increase her intentional use of self care  Used guided discovery to explore patient's perception of relationship challenges asking patient to identify supportive evidence for thoughts and against thoughts, encouraging a broader perspective.  Reviewed previous session regarding   LCSW assisted, actively listened, taught, shared, role/played, provided  LCSW reviewed behavior modification, distress tolerance and effective communication skills with patient.   Provided supportive space encouraging emotional release and processing of current psychosocial stressors  Diagnosis:No diagnosis found.  Plan:  Patient's goal of therapy is to not think so anxious. Wants to be able to go where she needs to go and it not be so stressful. And wants to not always be thinking and preparing for the worst, decrease what if's.     Decrease the frequency and intensity of anxiety symptoms. Objectives: Patient will identify and track anxiety symptoms weekly using a self-reported scale (0-10). Patient will demonstrate use of at least 4 coping strategies--including cognitive defusion, mindfulness, grounding, and somatic resourcing--during moments of heightened anxiety. Patient will practice daily mindfulness, body-based grounding, and cognitive defusion exercises, and report impact on anxiety symptoms in session. Patient will identify and track at least 2 body sensations associated with anxiety and 2 associated with calm or safety over the course of treatment. Interventions: Teach and reinforce CBT techniques to identify and challenge cognitive distortions. Introduce  ACT-based cognitive defusion strategies (e.g., "leaves on a stream," "thank your mind"). Guide patient in somatic resourcing (e.g., identifying felt sense of safety, orienting  to environment, resourcing with imagery). Engage patient in interoceptive awareness exercises to increase recognition of body sensations related to anxiety and regulation. Assign and process daily mindfulness and defusion practices, including somatic check-ins. Use grounding and DBT distress tolerance techniques as needed.   Increase ability to engage in daily activities and go to necessary places with reduced anticipatory anxiety. Objectives: Patient will identify specific situations or places avoided due to anxiety. Patient will create and implement an exposure hierarchy for feared situations. Patient will report a 50% reduction in avoidance behavior by the end of 12 months. Interventions: Use ACT strategies to identify values and commit to behavior aligned with those values. Use CBT exposure techniques to gradually increase tolerance for feared situations. Use mindfulness and grounding techniques before and after exposure activities. Explore somatic responses during exposure and provide tools for nervous system regulation.  Future Appointments  Date Time Provider Department Center  05/04/2024  2:00 PM Sonya Palma, LCSW [facility] None    Sonya Sonya, [place]

## 2024-05-12 DIAGNOSIS — O26892 Other specified pregnancy related conditions, second trimester: Secondary | ICD-10-CM | POA: Diagnosis not present

## 2024-05-12 DIAGNOSIS — Z6791 Unspecified blood type, Rh negative: Secondary | ICD-10-CM | POA: Diagnosis not present

## 2024-05-12 DIAGNOSIS — O0992 Supervision of high risk pregnancy, unspecified, second trimester: Secondary | ICD-10-CM | POA: Diagnosis not present

## 2024-05-12 DIAGNOSIS — O26893 Other specified pregnancy related conditions, third trimester: Secondary | ICD-10-CM | POA: Diagnosis not present

## 2024-05-12 DIAGNOSIS — Z23 Encounter for immunization: Secondary | ICD-10-CM | POA: Diagnosis not present

## 2024-05-18 DIAGNOSIS — L52 Erythema nodosum: Secondary | ICD-10-CM | POA: Diagnosis not present

## 2024-05-20 DIAGNOSIS — O99213 Obesity complicating pregnancy, third trimester: Secondary | ICD-10-CM | POA: Diagnosis not present

## 2024-05-20 DIAGNOSIS — O0993 Supervision of high risk pregnancy, unspecified, third trimester: Secondary | ICD-10-CM | POA: Diagnosis not present

## 2024-05-20 DIAGNOSIS — O26899 Other specified pregnancy related conditions, unspecified trimester: Secondary | ICD-10-CM | POA: Diagnosis not present

## 2024-05-20 DIAGNOSIS — Z6791 Unspecified blood type, Rh negative: Secondary | ICD-10-CM | POA: Diagnosis not present

## 2024-06-09 DIAGNOSIS — Z3A32 32 weeks gestation of pregnancy: Secondary | ICD-10-CM | POA: Diagnosis not present

## 2024-06-09 DIAGNOSIS — Z23 Encounter for immunization: Secondary | ICD-10-CM | POA: Diagnosis not present

## 2024-06-24 DIAGNOSIS — O0992 Supervision of high risk pregnancy, unspecified, second trimester: Secondary | ICD-10-CM | POA: Diagnosis not present

## 2024-06-24 DIAGNOSIS — Z720 Tobacco use: Secondary | ICD-10-CM | POA: Diagnosis not present

## 2024-07-05 DIAGNOSIS — O0993 Supervision of high risk pregnancy, unspecified, third trimester: Secondary | ICD-10-CM | POA: Diagnosis not present

## 2024-07-05 LAB — OB RESULTS CONSOLE GC/CHLAMYDIA
Chlamydia: NEGATIVE
Neisseria Gonorrhea: NEGATIVE

## 2024-07-05 LAB — OB RESULTS CONSOLE GBS: GBS: POSITIVE

## 2024-07-20 ENCOUNTER — Encounter: Payer: Self-pay | Admitting: Obstetrics and Gynecology

## 2024-07-20 ENCOUNTER — Other Ambulatory Visit: Payer: Self-pay

## 2024-07-20 ENCOUNTER — Inpatient Hospital Stay
Admission: EM | Admit: 2024-07-20 | Discharge: 2024-07-23 | DRG: 787 | Disposition: A | Attending: Obstetrics and Gynecology | Admitting: Obstetrics and Gynecology

## 2024-07-20 DIAGNOSIS — F419 Anxiety disorder, unspecified: Secondary | ICD-10-CM | POA: Diagnosis not present

## 2024-07-20 DIAGNOSIS — Z3A38 38 weeks gestation of pregnancy: Secondary | ICD-10-CM | POA: Diagnosis not present

## 2024-07-20 DIAGNOSIS — F17210 Nicotine dependence, cigarettes, uncomplicated: Secondary | ICD-10-CM | POA: Diagnosis present

## 2024-07-20 DIAGNOSIS — D62 Acute posthemorrhagic anemia: Secondary | ICD-10-CM | POA: Diagnosis not present

## 2024-07-20 DIAGNOSIS — O99334 Smoking (tobacco) complicating childbirth: Secondary | ICD-10-CM | POA: Diagnosis present

## 2024-07-20 DIAGNOSIS — O99824 Streptococcus B carrier state complicating childbirth: Secondary | ICD-10-CM | POA: Diagnosis present

## 2024-07-20 DIAGNOSIS — O403XX0 Polyhydramnios, third trimester, not applicable or unspecified: Secondary | ICD-10-CM | POA: Diagnosis present

## 2024-07-20 DIAGNOSIS — Z6791 Unspecified blood type, Rh negative: Secondary | ICD-10-CM

## 2024-07-20 DIAGNOSIS — Z2839 Other underimmunization status: Secondary | ICD-10-CM | POA: Diagnosis not present

## 2024-07-20 DIAGNOSIS — O9081 Anemia of the puerperium: Secondary | ICD-10-CM | POA: Diagnosis not present

## 2024-07-20 DIAGNOSIS — O4190X0 Disorder of amniotic fluid and membranes, unspecified, unspecified trimester, not applicable or unspecified: Secondary | ICD-10-CM | POA: Diagnosis not present

## 2024-07-20 DIAGNOSIS — O339 Maternal care for disproportion, unspecified: Secondary | ICD-10-CM | POA: Diagnosis not present

## 2024-07-20 DIAGNOSIS — O99214 Obesity complicating childbirth: Secondary | ICD-10-CM | POA: Diagnosis present

## 2024-07-20 DIAGNOSIS — O0993 Supervision of high risk pregnancy, unspecified, third trimester: Secondary | ICD-10-CM | POA: Diagnosis not present

## 2024-07-20 DIAGNOSIS — O26893 Other specified pregnancy related conditions, third trimester: Secondary | ICD-10-CM | POA: Diagnosis present

## 2024-07-20 DIAGNOSIS — F1290 Cannabis use, unspecified, uncomplicated: Secondary | ICD-10-CM | POA: Diagnosis present

## 2024-07-20 DIAGNOSIS — E66813 Obesity, class 3: Secondary | ICD-10-CM | POA: Diagnosis present

## 2024-07-20 DIAGNOSIS — O99324 Drug use complicating childbirth: Secondary | ICD-10-CM | POA: Diagnosis present

## 2024-07-20 DIAGNOSIS — K219 Gastro-esophageal reflux disease without esophagitis: Secondary | ICD-10-CM | POA: Diagnosis present

## 2024-07-20 DIAGNOSIS — O4290 Premature rupture of membranes, unspecified as to length of time between rupture and onset of labor, unspecified weeks of gestation: Principal | ICD-10-CM | POA: Diagnosis present

## 2024-07-20 DIAGNOSIS — O4292 Full-term premature rupture of membranes, unspecified as to length of time between rupture and onset of labor: Principal | ICD-10-CM | POA: Diagnosis present

## 2024-07-20 DIAGNOSIS — O9962 Diseases of the digestive system complicating childbirth: Secondary | ICD-10-CM | POA: Diagnosis present

## 2024-07-20 HISTORY — DX: Anxiety disorder, unspecified: F41.9

## 2024-07-20 LAB — CBC
HCT: 36 % (ref 36.0–46.0)
Hemoglobin: 12.1 g/dL (ref 12.0–15.0)
MCH: 28.6 pg (ref 26.0–34.0)
MCHC: 33.6 g/dL (ref 30.0–36.0)
MCV: 85.1 fL (ref 80.0–100.0)
Platelets: 253 K/uL (ref 150–400)
RBC: 4.23 MIL/uL (ref 3.87–5.11)
RDW: 13.5 % (ref 11.5–15.5)
WBC: 13 K/uL — ABNORMAL HIGH (ref 4.0–10.5)
nRBC: 0 % (ref 0.0–0.2)

## 2024-07-20 LAB — TYPE AND SCREEN
ABO/RH(D): O NEG
Antibody Screen: NEGATIVE

## 2024-07-20 LAB — RUPTURE OF MEMBRANE (ROM)PLUS: Rom Plus: POSITIVE

## 2024-07-20 MED ORDER — TERBUTALINE SULFATE 1 MG/ML IJ SOLN: 0.2500 mg | Freq: Once | INTRAMUSCULAR | Status: DC | PRN

## 2024-07-20 MED ORDER — OXYCODONE-ACETAMINOPHEN 5-325 MG PO TABS: 1.0000 | ORAL_TABLET | ORAL | Status: DC | PRN

## 2024-07-20 MED ORDER — FENTANYL CITRATE (PF) 100 MCG/2ML IJ SOLN
50.0000 ug | INTRAMUSCULAR | Status: DC | PRN
  Administered 2024-07-21 (×2): 100 ug via INTRAVENOUS
  Administered 2024-07-21: 50 ug via INTRAVENOUS
  Filled 2024-07-20 (×3): qty 2

## 2024-07-20 MED ORDER — OXYTOCIN-SODIUM CHLORIDE 30-0.9 UT/500ML-% IV SOLN
2.5000 [IU]/h | INTRAVENOUS | Status: DC
  Filled 2024-07-20: qty 500

## 2024-07-20 MED ORDER — MISOPROSTOL 200 MCG PO TABS
ORAL_TABLET | ORAL | Status: DC
  Filled 2024-07-20: qty 4

## 2024-07-20 MED ORDER — AMMONIA AROMATIC IN INHA
RESPIRATORY_TRACT | Status: DC
  Filled 2024-07-20: qty 10

## 2024-07-20 MED ORDER — OXYTOCIN 10 UNIT/ML IJ SOLN
INTRAMUSCULAR | Status: DC
  Filled 2024-07-20: qty 2

## 2024-07-20 MED ORDER — SODIUM CHLORIDE 0.9 % IV SOLN
5.0000 10*6.[IU] | Freq: Once | INTRAVENOUS | Status: AC
  Administered 2024-07-20: 5 10*6.[IU] via INTRAVENOUS
  Filled 2024-07-20: qty 5

## 2024-07-20 MED ORDER — OXYTOCIN-SODIUM CHLORIDE 30-0.9 UT/500ML-% IV SOLN
1.0000 m[IU]/min | INTRAVENOUS | Status: DC
  Administered 2024-07-20: 2 m[IU]/min via INTRAVENOUS
  Filled 2024-07-20: qty 500

## 2024-07-20 MED ORDER — LIDOCAINE HCL (PF) 1 % IJ SOLN
INTRAMUSCULAR | Status: DC
  Filled 2024-07-20: qty 30

## 2024-07-20 MED ORDER — LACTATED RINGERS IV SOLN: INTRAVENOUS | Status: DC

## 2024-07-20 MED ORDER — PENICILLIN G POT IN DEXTROSE 60000 UNIT/ML IV SOLN
3.0000 10*6.[IU] | INTRAVENOUS | Status: DC
  Administered 2024-07-20 – 2024-07-21 (×4): 3 10*6.[IU] via INTRAVENOUS
  Filled 2024-07-20 (×4): qty 50

## 2024-07-20 MED ORDER — ACETAMINOPHEN 325 MG PO TABS
650.0000 mg | ORAL_TABLET | ORAL | Status: DC | PRN
  Administered 2024-07-21: 650 mg via ORAL
  Filled 2024-07-20: qty 2

## 2024-07-20 MED ORDER — ONDANSETRON HCL 4 MG/2ML IJ SOLN
4.0000 mg | Freq: Four times a day (QID) | INTRAMUSCULAR | Status: DC | PRN
  Administered 2024-07-20 – 2024-07-21 (×3): 4 mg via INTRAVENOUS
  Filled 2024-07-20 (×3): qty 2

## 2024-07-20 MED ORDER — LACTATED RINGERS IV SOLN: 500.0000 mL | INTRAVENOUS | Status: DC | PRN

## 2024-07-20 MED ORDER — SOD CITRATE-CITRIC ACID 500-334 MG/5ML PO SOLN: 30.0000 mL | ORAL | Status: DC | PRN

## 2024-07-20 MED ORDER — OXYTOCIN BOLUS FROM INFUSION: 333.0000 mL | Freq: Once | INTRAVENOUS | Status: DC

## 2024-07-20 MED ORDER — LIDOCAINE HCL (PF) 1 % IJ SOLN: 30.0000 mL | INTRAMUSCULAR | Status: DC | PRN

## 2024-07-20 MED ORDER — OXYCODONE-ACETAMINOPHEN 5-325 MG PO TABS: 2.0000 | ORAL_TABLET | ORAL | Status: DC | PRN

## 2024-07-20 NOTE — OB Triage Note (Signed)
 Sonya Ferguson reported to labor and delivery for evaluation with complaints of leaking of clear fluid since 1330. She reports not feeling the baby move today as well.  States denies vaginal bleeding and isn't sure if she's had any contractions today but was having some yesterday.

## 2024-07-20 NOTE — H&P (Signed)
 OB History & Physical   History of Present Illness:  Chief Complaint:   HPI:  Sonya Ferguson is a 28 y.o. G1P0 female at [redacted]w[redacted]d dated by LMP.  She presents to L&D for SROM  She reports:  -active fetal movement -LOF/SROM at 1330 -no vaginal bleeding -no contractions  Pregnancy Issues: Obesity- pregravid BMI: 40 Hx of anxiety/ depression- bipolar? Tobacco use Marijuana use in pregnancy Varicella non-immune - recommend vaccination postpartum RH Negative Mild Polyhydramnios     Maternal Medical History:   Past Medical History:  Diagnosis Date   Anxiety    GERD (gastroesophageal reflux disease)     Past Surgical History:  Procedure Laterality Date   SHOULDER ARTHROSCOPY Right 02/14/2022   Procedure: Shoulder arthroscopy, distal clavicle excision, and open subpectoral biceps tenodesis;  Surgeon: Tobie Priest, MD;  Location: [facility];  Service: Orthopedics;  Laterality: Right;    Allergies  Allergen Reactions   Wound Dressing Adhesive Itching and Rash    Any adhesive     Prior to Admission medications   Medication Sig Start Date End Date Taking? Authorizing Provider  ondansetron  (ZOFRAN -ODT) 4 MG disintegrating tablet Take 1 tablet (4 mg total) by mouth every 8 (eight) hours as needed for nausea or vomiting. Patient not taking: Reported on 07/20/2024 12/25/23   Menshew, Candida LULLA Kings, PA-C  pantoprazole  (PROTONIX ) 40 MG tablet Take 1 tablet (40 mg total) by mouth daily. Patient not taking: Reported on 02/05/2022 09/01/21 10/01/21  Claudene Arthea SQUIBB, MD  sucralfate  (CARAFATE ) 1 GM/10ML suspension Take 10 mLs (1 g total) by mouth 4 (four) times daily for 5 days. Patient not taking: Reported on 02/05/2022 09/01/21 09/06/21  Claudene Arthea SQUIBB, MD     Prenatal care site: [facility]  Social History: She  reports that she has been smoking cigarettes. She started smoking about 11 years ago. She has a 13.5 pack-year smoking history. She has never used smokeless  tobacco. She reports current drug use. Drug: Marijuana. She reports that she does not drink alcohol.  Family History: family history is not on file.   Review of Systems: A full review of systems was performed and negative except as noted in the HPI.    Physical Exam:  Vital Signs: BP 126/71 (BP Location: Right Arm)   Pulse 89   Temp 98.3 F (36.8 C) (Oral)   Resp 18   Ht 5' 5 (1.651 m)   Wt 117.9 kg   LMP 10/25/2023 (Exact Date)   BMI 43.27 kg/m   General:   alert and cooperative  Skin:  normal  Neurologic:    Alert & oriented x 3  Lungs:   Nl effort  Heart:   regular rate and rhythm  Abdomen:  normal findings: soft, non-tender  Extremities: : non-tender, symmetric, no edema bilaterally.      EFW: 06/24/24: EFW 2773g @76 %, 6lb2oz   Results for orders placed or performed during the hospital encounter of 07/20/24 (from the past 24 hours)  Rupture of Membrane (ROM) Plus     Status: None   Collection Time: 07/20/24  3:53 PM  Result Value Ref Range   Rom Plus POSITIVE   CBC     Status: Abnormal   Collection Time: 07/20/24  5:15 PM  Result Value Ref Range   WBC 13.0 (H) 4.0 - 10.5 K/uL   RBC 4.23 3.87 - 5.11 MIL/uL   Hemoglobin 12.1 12.0 - 15.0 g/dL   HCT 63.9 63.9 - 53.9 %   MCV  85.1 80.0 - 100.0 fL   MCH 28.6 26.0 - 34.0 pg   MCHC 33.6 30.0 - 36.0 g/dL   RDW 86.4 88.4 - 84.4 %   Platelets 253 150 - 400 K/uL   nRBC 0.0 0.0 - 0.2 %  Type and screen [facility]     Status: None   Collection Time: 07/20/24  5:15 PM  Result Value Ref Range   ABO/RH(D) O NEG    Antibody Screen NEG    Sample Expiration      07/23/2024,2359 Performed at [facility], [address] [place], [place] [address]     Pertinent Results:  Prenatal Labs: Blood type/Rh O neg  Antibody screen neg  Rubella Immune  Varicella Non - Immune  RPR NR  HBsAg Neg  HIV NR  GC neg  Chlamydia neg  Genetic screening negative  1 hour GTT 130  3 hour GTT   GBS Pos    FHT: FHR: 135 bpm, variability: moderate,  accelerations:  Present,  decelerations:  Absent Category/reactivity:  Category I TOCO: Occasional  SVE: 2-3/80/-3    Assessment:  Sonya Ferguson is a 28 y.o. G1P0 female at [redacted]w[redacted]d with SROM.   Plan:  1. Admit to Labor & Delivery; consents reviewed and obtained  2. Fetal Well being  - Fetal Tracing: Cat I - GBS pos - Presentation: vtx confirmed by sve   3. Routine OB: - Prenatal labs reviewed, as above - Rh neg - CBC & T&S on admit - Clear fluids, IVF  4. Monitoring of Labor -  Contractions by external toco in place -  Plan for continuous fetal monitoring  -  Maternal pain control as desired: IVPM, nitrous, regional anesthesia - Anticipate vaginal delivery  5. Post Partum Planning: - Infant feeding: TBD - Contraception: TBD - Needs varicella vaccine  - Tdap: Given 05/12/24  - Flu: Given 05/12/24  - RSV: Given 06/09/24   DELON COE, CNM 07/20/2024 7:56 PM

## 2024-07-21 ENCOUNTER — Inpatient Hospital Stay: Admitting: Anesthesiology

## 2024-07-21 ENCOUNTER — Encounter: Payer: Self-pay | Admitting: Obstetrics and Gynecology

## 2024-07-21 ENCOUNTER — Encounter: Admission: EM | Disposition: A | Payer: Self-pay | Source: Home / Self Care | Attending: Certified Nurse Midwife

## 2024-07-21 DIAGNOSIS — O99324 Drug use complicating childbirth: Secondary | ICD-10-CM

## 2024-07-21 DIAGNOSIS — E669 Obesity, unspecified: Secondary | ICD-10-CM

## 2024-07-21 DIAGNOSIS — O403XX0 Polyhydramnios, third trimester, not applicable or unspecified: Secondary | ICD-10-CM

## 2024-07-21 DIAGNOSIS — O360130 Maternal care for anti-D [Rh] antibodies, third trimester, not applicable or unspecified: Secondary | ICD-10-CM

## 2024-07-21 DIAGNOSIS — F419 Anxiety disorder, unspecified: Secondary | ICD-10-CM

## 2024-07-21 DIAGNOSIS — O4202 Full-term premature rupture of membranes, onset of labor within 24 hours of rupture: Secondary | ICD-10-CM

## 2024-07-21 DIAGNOSIS — O99214 Obesity complicating childbirth: Secondary | ICD-10-CM

## 2024-07-21 DIAGNOSIS — F1290 Cannabis use, unspecified, uncomplicated: Secondary | ICD-10-CM

## 2024-07-21 DIAGNOSIS — O99344 Other mental disorders complicating childbirth: Secondary | ICD-10-CM

## 2024-07-21 DIAGNOSIS — Z3A38 38 weeks gestation of pregnancy: Secondary | ICD-10-CM

## 2024-07-21 LAB — SYPHILIS: RPR W/REFLEX TO RPR TITER AND TREPONEMAL ANTIBODIES, TRADITIONAL SCREENING AND DIAGNOSIS ALGORITHM: RPR Ser Ql: NONREACTIVE

## 2024-07-21 SURGERY — Surgical Case
Anesthesia: Epidural

## 2024-07-21 MED ORDER — DIPHENHYDRAMINE HCL 25 MG PO CAPS: 25.0000 mg | ORAL_CAPSULE | Freq: Four times a day (QID) | ORAL | Status: DC | PRN

## 2024-07-21 MED ORDER — BUPIVACAINE HCL 0.25 % IJ SOLN
INTRAMUSCULAR | Status: DC | PRN
  Administered 2024-07-21: 60 mL

## 2024-07-21 MED ORDER — LIDOCAINE-EPINEPHRINE (PF) 1.5 %-1:200000 IJ SOLN
INTRAMUSCULAR | Status: DC | PRN
  Administered 2024-07-21: 3 mL via EPIDURAL

## 2024-07-21 MED ORDER — MORPHINE SULFATE (PF) 0.5 MG/ML IJ SOLN
INTRAMUSCULAR | Status: DC | PRN
  Administered 2024-07-21: 100 ug via INTRATHECAL

## 2024-07-21 MED ORDER — FENTANYL-BUPIVACAINE-NACL 0.5-0.125-0.9 MG/250ML-% EP SOLN
12.0000 mL/h | EPIDURAL | Status: DC | PRN
  Administered 2024-07-21: 12 mL/h via EPIDURAL
  Filled 2024-07-21: qty 250

## 2024-07-21 MED ORDER — ZOLPIDEM TARTRATE 5 MG PO TABS: 5.0000 mg | ORAL_TABLET | Freq: Every evening | ORAL | Status: DC | PRN

## 2024-07-21 MED ORDER — LACTATED RINGERS IV SOLN: 500.0000 mL | Freq: Once | INTRAVENOUS | Status: DC

## 2024-07-21 MED ORDER — SODIUM CHLORIDE 0.9 % IV SOLN
500.0000 mg | INTRAVENOUS | Status: DC
  Administered 2024-07-21: 250 mg via INTRAVENOUS
  Filled 2024-07-21 (×2): qty 5

## 2024-07-21 MED ORDER — DEXAMETHASONE SOD PHOSPHATE PF 10 MG/ML IJ SOLN
INTRAMUSCULAR | Status: DC | PRN
  Administered 2024-07-21: 10 mg via INTRAVENOUS

## 2024-07-21 MED ORDER — FENTANYL CITRATE (PF) 100 MCG/2ML IJ SOLN
INTRAMUSCULAR | Status: DC | PRN
  Administered 2024-07-21: 15 ug via INTRATHECAL

## 2024-07-21 MED ORDER — BUPIVACAINE HCL (PF) 0.25 % IJ SOLN
INTRAMUSCULAR | Status: AC
  Filled 2024-07-21: qty 60

## 2024-07-21 MED ORDER — 0.9 % SODIUM CHLORIDE (POUR BTL) OPTIME
TOPICAL | Status: DC | PRN
  Administered 2024-07-21: 1000 mL

## 2024-07-21 MED ORDER — OXYTOCIN-SODIUM CHLORIDE 30-0.9 UT/500ML-% IV SOLN
2.5000 [IU]/h | INTRAVENOUS | Status: AC
  Administered 2024-07-21: 2.5 [IU]/h via INTRAVENOUS
  Filled 2024-07-21: qty 500

## 2024-07-21 MED ORDER — COCONUT OIL OIL
1.0000 | TOPICAL_OIL | Status: DC | PRN
  Filled 2024-07-21: qty 7.5

## 2024-07-21 MED ORDER — CHLORHEXIDINE GLUCONATE 0.12 % MT SOLN
OROMUCOSAL | Status: AC
  Filled 2024-07-21: qty 15

## 2024-07-21 MED ORDER — MORPHINE SULFATE (PF) 0.5 MG/ML IJ SOLN
INTRAMUSCULAR | Status: AC
  Filled 2024-07-21: qty 10

## 2024-07-21 MED ORDER — ONDANSETRON HCL 4 MG/2ML IJ SOLN
INTRAMUSCULAR | Status: DC | PRN
  Administered 2024-07-21: 4 mg via INTRAVENOUS

## 2024-07-21 MED ORDER — ENOXAPARIN SODIUM 40 MG/0.4ML IJ SOSY
40.0000 mg | PREFILLED_SYRINGE | INTRAMUSCULAR | Status: DC
  Administered 2024-07-22: 40 mg via SUBCUTANEOUS
  Filled 2024-07-21 (×2): qty 0.4

## 2024-07-21 MED ORDER — SENNOSIDES-DOCUSATE SODIUM 8.6-50 MG PO TABS
2.0000 | ORAL_TABLET | Freq: Every day | ORAL | Status: DC
  Administered 2024-07-22 – 2024-07-23 (×2): 2 via ORAL
  Filled 2024-07-21 (×2): qty 2

## 2024-07-21 MED ORDER — SIMETHICONE 80 MG PO CHEW
80.0000 mg | CHEWABLE_TABLET | Freq: Three times a day (TID) | ORAL | Status: DC
  Administered 2024-07-22 – 2024-07-23 (×4): 80 mg via ORAL
  Filled 2024-07-21 (×4): qty 1

## 2024-07-21 MED ORDER — KETOROLAC TROMETHAMINE 30 MG/ML IJ SOLN
30.0000 mg | Freq: Four times a day (QID) | INTRAMUSCULAR | Status: DC
  Administered 2024-07-21 – 2024-07-22 (×2): 30 mg via INTRAVENOUS
  Filled 2024-07-21 (×3): qty 1

## 2024-07-21 MED ORDER — ACETAMINOPHEN 500 MG PO TABS
1000.0000 mg | ORAL_TABLET | Freq: Four times a day (QID) | ORAL | Status: DC
  Administered 2024-07-21 – 2024-07-22 (×2): 1000 mg via ORAL
  Filled 2024-07-21 (×2): qty 2

## 2024-07-21 MED ORDER — GABAPENTIN 300 MG PO CAPS
300.0000 mg | ORAL_CAPSULE | Freq: Every day | ORAL | Status: DC
  Administered 2024-07-21 – 2024-07-22 (×2): 300 mg via ORAL
  Filled 2024-07-21 (×2): qty 1

## 2024-07-21 MED ORDER — VARICELLA VIRUS VACCINE LIVE 1350 PFU/0.5ML IJ SUSR
0.5000 mL | Freq: Once | INTRAMUSCULAR | Status: AC
  Administered 2024-07-23: 0.5 mL via SUBCUTANEOUS
  Filled 2024-07-21 (×2): qty 0.5

## 2024-07-21 MED ORDER — PRENATAL MULTIVITAMIN CH
1.0000 | ORAL_TABLET | Freq: Every day | ORAL | Status: DC
  Administered 2024-07-22 – 2024-07-23 (×2): 1 via ORAL
  Filled 2024-07-21 (×2): qty 1

## 2024-07-21 MED ORDER — EPHEDRINE 5 MG/ML INJ: 10.0000 mg | INTRAVENOUS | Status: DC | PRN

## 2024-07-21 MED ORDER — LIDOCAINE HCL (PF) 1 % IJ SOLN
INTRAMUSCULAR | Status: DC | PRN
  Administered 2024-07-21: 3 mL via SUBCUTANEOUS
  Administered 2024-07-21: 4 mL via SUBCUTANEOUS

## 2024-07-21 MED ORDER — FENTANYL CITRATE (PF) 100 MCG/2ML IJ SOLN
INTRAMUSCULAR | Status: AC
  Filled 2024-07-21: qty 2

## 2024-07-21 MED ORDER — BUPIVACAINE IN DEXTROSE 0.75-8.25 % IT SOLN
INTRATHECAL | Status: DC | PRN
  Administered 2024-07-21: 1.6 mL via INTRATHECAL

## 2024-07-21 MED ORDER — SODIUM CHLORIDE 0.9 % IV SOLN
INTRAVENOUS | Status: DC | PRN
  Administered 2024-07-21 (×2): 5 mL via EPIDURAL

## 2024-07-21 MED ORDER — SIMETHICONE 80 MG PO CHEW: 80.0000 mg | CHEWABLE_TABLET | ORAL | Status: DC | PRN

## 2024-07-21 MED ORDER — PHENYLEPHRINE 80 MCG/ML (10ML) SYRINGE FOR IV PUSH (FOR BLOOD PRESSURE SUPPORT)
PREFILLED_SYRINGE | INTRAVENOUS | Status: DC | PRN
  Administered 2024-07-21: 80 ug via INTRAVENOUS

## 2024-07-21 MED ORDER — CEFAZOLIN SODIUM-DEXTROSE 3-4 GM/150ML-% IV SOLN
3.0000 g | Freq: Once | INTRAVENOUS | Status: AC
  Administered 2024-07-21: 3 g via INTRAVENOUS
  Filled 2024-07-21: qty 150

## 2024-07-21 MED ORDER — GABAPENTIN 300 MG PO CAPS
300.0000 mg | ORAL_CAPSULE | Freq: Once | ORAL | Status: AC
  Administered 2024-07-21: 300 mg via ORAL
  Filled 2024-07-21: qty 1

## 2024-07-21 MED ORDER — PHENYLEPHRINE 80 MCG/ML (10ML) SYRINGE FOR IV PUSH (FOR BLOOD PRESSURE SUPPORT): 80.0000 ug | PREFILLED_SYRINGE | INTRAVENOUS | Status: DC | PRN

## 2024-07-21 MED ORDER — DIPHENHYDRAMINE HCL 50 MG/ML IJ SOLN: 12.5000 mg | INTRAMUSCULAR | Status: DC | PRN

## 2024-07-21 MED ORDER — KETOROLAC TROMETHAMINE 30 MG/ML IJ SOLN
INTRAMUSCULAR | Status: DC | PRN
  Administered 2024-07-21: 30 mg via INTRAVENOUS

## 2024-07-21 MED ORDER — OXYTOCIN-SODIUM CHLORIDE 30-0.9 UT/500ML-% IV SOLN
INTRAVENOUS | Status: DC | PRN
  Administered 2024-07-21: 500 mL via INTRAVENOUS

## 2024-07-21 MED ORDER — SOD CITRATE-CITRIC ACID 500-334 MG/5ML PO SOLN
ORAL | Status: AC
  Filled 2024-07-21: qty 15

## 2024-07-21 MED ORDER — ACETAMINOPHEN 500 MG PO TABS: 1000.0000 mg | ORAL_TABLET | Freq: Once | ORAL | Status: DC

## 2024-07-21 MED ORDER — PHENYLEPHRINE HCL-NACL 20-0.9 MG/250ML-% IV SOLN
INTRAVENOUS | Status: DC | PRN
  Administered 2024-07-21: 40 ug/min via INTRAVENOUS

## 2024-07-21 MED ORDER — IBUPROFEN 600 MG PO TABS: 600.0000 mg | ORAL_TABLET | Freq: Four times a day (QID) | ORAL | Status: DC

## 2024-07-21 MED ORDER — PHENYLEPHRINE HCL-NACL 20-0.9 MG/250ML-% IV SOLN
INTRAVENOUS | Status: AC
  Filled 2024-07-21: qty 250

## 2024-07-21 SURGICAL SUPPLY — 29 items
CHLORAPREP W/TINT 26 (MISCELLANEOUS) ×1 IMPLANT
DRESSING TELFA 8X10 (GAUZE/BANDAGES/DRESSINGS) IMPLANT
DRSG TELFA 3X8 NADH STRL (GAUZE/BANDAGES/DRESSINGS) ×1 IMPLANT
ELECT CAUTERY BLADE 6.4 (BLADE) ×1 IMPLANT
ELECTRODE REM PT RTRN 9FT ADLT (ELECTROSURGICAL) ×1 IMPLANT
GAUZE SPONGE 4X4 12PLY STRL (GAUZE/BANDAGES/DRESSINGS) ×1 IMPLANT
GLOVE SURG SYN 8.0 PF PI (GLOVE) ×1 IMPLANT
GOWN STRL REUS W/ TWL LRG LVL3 (GOWN DISPOSABLE) ×2 IMPLANT
GOWN STRL REUS W/ TWL XL LVL3 (GOWN DISPOSABLE) ×1 IMPLANT
MANIFOLD NEPTUNE II (INSTRUMENTS) ×1 IMPLANT
MAT PREVALON FULL STRYKER (MISCELLANEOUS) ×1 IMPLANT
NDL HYPO 22X1.5 SAFETY MO (MISCELLANEOUS) ×1 IMPLANT
NEEDLE HYPO 22X1.5 SAFETY MO (MISCELLANEOUS) ×1 IMPLANT
PACK C SECTION AR (MISCELLANEOUS) ×1 IMPLANT
PAD OB MATERNITY 11 LF (PERSONAL CARE ITEMS) ×1 IMPLANT
PAD PREP OB/GYN DISP 24X41 (PERSONAL CARE ITEMS) ×1 IMPLANT
SCRUB CHG 4% DYNA-HEX 4OZ (MISCELLANEOUS) ×1 IMPLANT
SOLN 0.9% NACL POUR BTL 1000ML (IV SOLUTION) ×1 IMPLANT
SOLN STERILE WATER 500 ML (IV SOLUTION) ×1 IMPLANT
STAPLER INSORB 30 2030 C-SECTI (MISCELLANEOUS) IMPLANT
STRAP SAFETY 5IN WIDE (MISCELLANEOUS) ×1 IMPLANT
SUCT VACUUM KIWI BELL (SUCTIONS) IMPLANT
SUT CHROMIC 1 CTX 36 (SUTURE) ×3 IMPLANT
SUT PLAIN GUT 0 (SUTURE) ×2 IMPLANT
SUT VIC AB 0 CT1 36 (SUTURE) ×2 IMPLANT
SUT VIC AB 2-0 SH 27XBRD (SUTURE) IMPLANT
SYR 30ML LL (SYRINGE) ×2 IMPLANT
TAPE PAPER 3X10 WHT MICROPORE (GAUZE/BANDAGES/DRESSINGS) IMPLANT
TRAP FLUID SMOKE EVACUATOR (MISCELLANEOUS) ×1 IMPLANT

## 2024-07-21 NOTE — Brief Op Note (Signed)
 07/20/2024 - 07/21/2024  3:30 PM  PATIENT:  Sonya Ferguson  28 y.o. female  PRE-OPERATIVE DIAGNOSIS: arrest of descent   POST-OPERATIVE DIAGNOSIS: Arrest of descent  Cephalopelvic disproportion   PROCEDURE:  Procedure(s): CESAREAN DELIVERY (N/A) Primary LTCS  SURGEON:  Surgeons and Role:    * Winifred Balogh, Debby PARAS, MD - Primary  PHYSICIAN ASSISTANT: CNM Zelda Hummer   ASSISTANTS: none   ANESTHESIA:   spinal  EBL: qbl 880cc Iof= 900 cc  Ou 100cc  BLOOD ADMINISTERED:none  DRAINS: Urinary Catheter (Foley)   LOCAL MEDICATIONS USED:  MARCAINE      SPECIMEN:  No Specimen  DISPOSITION OF SPECIMEN:  N/A  COUNTS:  YES  TOURNIQUET:  * No tourniquets in log *  DICTATION: .Other Dictation: Dictation Number verbal   PLAN OF CARE: Admit to inpatient   PATIENT DISPOSITION:  PACU - hemodynamically stable.   Delay start of Pharmacological VTE agent (>24hrs) due to surgical blood loss or risk of bleeding: not applicable

## 2024-07-21 NOTE — Anesthesia Procedure Notes (Signed)
 Epidural Patient location during procedure: OB Start time: 07/21/2024 5:03 AM End time: 07/21/2024 5:07 AM  Staffing Anesthesiologist: Dario Barter, MD Performed: anesthesiologist   Preanesthetic Checklist Completed: patient identified, IV checked, site marked, risks and benefits discussed, surgical consent, monitors and equipment checked, pre-op evaluation and timeout performed  Epidural Patient position: sitting Prep: ChloraPrep Patient monitoring: heart rate, continuous pulse ox and blood pressure Approach: midline Location: L3-L4 Injection technique: LOR saline  Needle:  Needle type: Tuohy  Needle gauge: 17 G Needle length: 9 cm Needle insertion depth: 8 cm Catheter type: closed end flexible Catheter size: 19 Gauge Catheter at skin depth: 14 cm Test dose: negative and 1.5% lidocaine  with Epi 1:200 K  Assessment Sensory level: T10 Events: blood not aspirated, no cerebrospinal fluid, injection not painful, no injection resistance, no paresthesia and negative IV test  Additional Notes 1st attempt Pt. Evaluated and documentation done after procedure finished. Patient identified. Risks/Benefits/Options discussed with patient including but not limited to bleeding, infection, nerve damage, paralysis, failed block, incomplete pain control, headache, blood pressure changes, nausea, vomiting, reactions to medication both or allergic, itching and postpartum back pain. Confirmed with bedside nurse the patient's most recent platelet count. Confirmed with patient that they are not currently taking any anticoagulation, have any bleeding history or any family history of bleeding disorders. Patient expressed understanding and wished to proceed. All questions were answered. Sterile technique was used throughout the entire procedure. Please see nursing notes for vital signs. Test dose was given through epidural catheter and negative prior to continuing to dose epidural or start infusion.  Warning signs of high block given to the patient including shortness of breath, tingling/numbness in hands, complete motor block, or any concerning symptoms with instructions to call for help. Patient was given instructions on fall risk and not to get out of bed. All questions and concerns addressed with instructions to call with any issues or inadequate analgesia.    Patient tolerated the insertion well without immediate complications.Reason for block:procedure for pain

## 2024-07-21 NOTE — Progress Notes (Signed)
 Labor Progress Note  Sonya Ferguson is a 28 y.o. G1P0 at [redacted]w[redacted]d by LMP admitted for rupture of membranes  Subjective: pt is coping well  Objective: BP 126/71 (BP Location: Right Arm)   Pulse 89   Temp 98.3 F (36.8 C) (Oral)   Resp 18   Ht 5' 5 (1.651 m)   Wt 117.9 kg   LMP 10/25/2023 (Exact Date)   BMI 43.27 kg/m    Fetal Assessment: FHT:  FHR: 125 bpm, variability: moderate,  accelerations:  Present,  decelerations:  Absent Category/reactivity:  Category I UC:   regular, every 2-3 minutes SVE:    Dilation: 2cm  Effacement: 70%  Station:  -3  Consistency: soft  Position: middle  Membrane status:SROM at 1330 Amniotic color: Clear  Labs: Lab Results  Component Value Date   WBC 13.0 (H) 07/20/2024   HGB 12.1 07/20/2024   HCT 36.0 07/20/2024   MCV 85.1 07/20/2024   PLT 253 07/20/2024    Assessment / Plan: Augmentation of labor, progressing well 1330 SROM 2031 Pitocin started Currently Pitocin at 4mU  Labor: Progressing normally Preeclampsia:  126/71 Fetal Wellbeing:  Category I Pain Control:  Labor support without medications I/D:  Afebrile, GBS pos - Abx x1 dose, SROM x 11 hours  Anticipated MOD:  NSVD  Jenifer E Kristeena Meineke, CNM 07/21/2024, 12:48 AM

## 2024-07-21 NOTE — Anesthesia Preprocedure Evaluation (Addendum)
 Anesthesia Evaluation  Patient identified by MRN, date of birth, ID band Patient awake    Reviewed: Allergy & Precautions, H&P , NPO status , Patient's Chart, lab work & pertinent test results, reviewed documented beta blocker date and time   History of Anesthesia Complications Negative for: history of anesthetic complications  Airway Mallampati: III  TM Distance: >3 FB Neck ROM: full    Dental no notable dental hx.    Pulmonary neg shortness of breath, neg COPD, neg recent URI, Current Smoker   Pulmonary exam normal breath sounds clear to auscultation       Cardiovascular Exercise Tolerance: Good negative cardio ROS Normal cardiovascular exam Rhythm:regular Rate:Normal     Neuro/Psych  PSYCHIATRIC DISORDERS Anxiety Depression    negative neurological ROS     GI/Hepatic ,GERD  ,,NAFLD   Endo/Other  neg diabetes  Class 3 obesity  Renal/GU negative Renal ROS  negative genitourinary   Musculoskeletal   Abdominal   Peds  Hematology negative hematology ROS (+)   Anesthesia Other Findings Past Medical History: No date: Anxiety No date: GERD (gastroesophageal reflux disease)   Reproductive/Obstetrics (+) Pregnancy                              Anesthesia Physical Anesthesia Plan  ASA: 3  Anesthesia Plan: Epidural   Post-op Pain Management:    Induction:   PONV Risk Score and Plan:   Airway Management Planned:   Additional Equipment:   Intra-op Plan:   Post-operative Plan:   Informed Consent: I have reviewed the patients History and Physical, chart, labs and discussed the procedure including the risks, benefits and alternatives for the proposed anesthesia with the patient or authorized representative who has indicated his/her understanding and acceptance.     Dental Advisory Given  Plan Discussed with: Anesthesiologist, CRNA and Surgeon  Anesthesia Plan Comments:           Anesthesia Quick Evaluation

## 2024-07-21 NOTE — Progress Notes (Signed)
 Labor Progress Note  Sonya Ferguson is a 28 y.o. G1P0 at [redacted]w[redacted]d by LMP admitted for rupture of membranes  Subjective: Pt is getting an epidural now  Objective: BP (!) 144/94 (BP Location: Left Arm)   Pulse 82   Temp 98 F (36.7 C) (Oral)   Resp 20   Ht 5' 5 (1.651 m)   Wt 117.9 kg   LMP 10/25/2023 (Exact Date)   BMI 43.27 kg/m    Fetal Assessment: FHT:  FHR: 125 bpm, variability: moderate,  accelerations:  Present,  decelerations:  Absent Category/reactivity:  Category I UC:   regular, every 1-3 minutes SVE:    Dilation: 6cm  Effacement: 80%  Station:  -1  Consistency: soft  Position: anterior  Membrane status: SROM at 1330 Amniotic color: Clear  Labs: Lab Results  Component Value Date   WBC 13.0 (H) 07/20/2024   HGB 12.1 07/20/2024   HCT 36.0 07/20/2024   MCV 85.1 07/20/2024   PLT 253 07/20/2024    Assessment / Plan: Augmentation of labor, progressing well 1330 SROM 1949 2.5/70/-3 2031 Pitocin started 0110 3.5/80/-2 0412 6/80/-1 Currently Pitocin at 2mU  Labor: Progressing normally Preeclampsia:  144/94 Fetal Wellbeing:  Category I Pain Control:  Epidural I/D:  Afebrile, GBS pos - Abx x3 dose, SROM x 16 hours  Anticipated MOD:  NSVD  Jenifer E Kalana Yust, CNM 07/21/2024, 5:23 AM

## 2024-07-21 NOTE — Progress Notes (Signed)
 Patient ID: Sonya Ferguson, female   DOB: 01-08-1996, 28 y.o.   MRN: [id number] Cx now 7.5 cm . Cat 1 fetal monitoring  Pain controled with CLE

## 2024-07-21 NOTE — Op Note (Unsigned)
 NAME: Sonya Ferguson, Sonya Ferguson MEDICAL RECORD NO: [id number] ACCOUNT NO: [id number] DATE OF BIRTH: 02-01-1996 FACILITY: [facility] LOCATION: [facility] PHYSICIAN: Debby DOROTHA Dinsmore, MD  Operative Report   PREOPERATIVE DIAGNOSES: 1.  38 plus 4 weeks estimated gestational age. 2.  Arrest of descent.  POSTOPERATIVE DIAGNOSES: 1.  38 plus 4 weeks estimated gestational age. 2.  Arrest of descent. 3.  Cephalopelvic disproportion. 4.  Vigorous female delivered.  PROCEDURE:  Primary low transverse cesarean section.  ANESTHESIA:  Spinal.  SURGEON:  Debby DOROTHA Dinsmore, MD.  FIRST ASSISTANT:  Zelda Hummer, Certified Nurse Midwife.  INDICATIONS:  38 plus 4 weeks estimated gestational age.  The patient labored and pushed for 3 hours, did not progress past +1 station.  Asynclitic fetal presentation.  The patient was counseled regarding role for primary low transverse cesarean section.  DESCRIPTION OF PROCEDURE:  After adequate spinal anesthesia, the patient was placed in dorsal supine position with a hip roll on the right side.  The patient's abdomen and vagina were prepped and draped in normal sterile fashion.  A timeout was  performed.  The patient did receive 3 grams IV Ancef  and 500 mg azithromycin  for surgical prophylaxis.  Pfannenstiel incision was made 2 fingerbreadths above the symphysis pubis.  Sharp dissection was used to identify the fascia.  The fascia was opened  in the midline and opened in a transverse fashion.  The superior aspect of the fascia was grasped with Kocher clamps and the recti muscles were dissected free.  The inferior aspect of the fascia was grasped with Kocher clamps and the pyramidalis muscle  was dissected free.  Entry into the peritoneal cavity was accomplished sharply.  The vesicouterine peritoneal fold was identified and a bladder flap was created and the bladder was reflected inferiorly.  A low transverse uterine incision was made.  Upon  entry into the endometrial  cavity, clear fluid resulted.  The incision was extended with blunt transverse traction.  An extremely wedged fetal head was ultimately delivered up to the incision.  A Kiwi Bell vacuum was applied to the occiput.  With 1 pull,  the head was delivered and the vacuum was removed.  Loose nuchal cord was reduced.  The shoulders and body were then delivered without difficulty.  Vigorous female was then dried on the mother's abdomen for 60 seconds.  Vigorous female, cord was clamped and  cut and passed to nursery staff who assigned Apgar scores of 8 and 9.  Cord blood was taken.  The placenta was manually delivered and the uterus was exteriorized and the endometrial cavity was wiped clean with a laparotomy tape.  The uterine incision  was then closed with 1 chromic suture in a running locking fashion.  Several additional figure-of-eight sutures were required for hemostasis.  The fallopian tubes and ovaries appeared normal.  The posterior cul-de-sac was then irrigated and suctioned and  the uterus was placed back in the abdominal cavity and the paracolic gutters were wiped clean with a laparotomy tape.  The uterine incision again appeared hemostatic.  The fascia was then closed with 0 Vicryl suture in a running non-locking fashion.   Two separate sutures were used.  The fascial edges were injected with 30 mL of  0.25% Marcaine .  Subcutaneous tissues were irrigated and Bovied and given the depth of the subcutaneous tissues of 4 cm, dead space was closed with the 2-0 chromic suture.   The skin was reapproximated with Insorb absorbable staples.  An additional 20 mL of Marcaine  solution was  injected beneath the skin.  There were no complications. Of  Note CNM Thompson's expertise in retraction and fundal pressure was needed for this procedure . QUANTITATIVE BLOOD LOSS:  880 mL.  INTRAOPERATIVE FLUIDS:  900 mL.  URINE OUTPUT:  100 mL.  DISPOSITION: The patient did receive 30 mg Toradol  at the end of the procedure and  was taken to recovery room in good condition.   NIK D: 07/21/2024 7:53:36 pm T: 07/21/2024 8:19:00 pm  JOB: [id number]/ [id number]

## 2024-07-21 NOTE — Discharge Summary (Shared)
 Obstetrical Discharge Summary  Patient Name: Sonya Ferguson DOB: February 25, 1996 MRN: [id number]  Date of Admission: 07/20/2024 Date of Delivery: 07/21/2024 Delivered by: ONEIDA Dinsmore MD Date of Discharge: 07/23/2024  Primary OB: [facility]  OFE:Ejupzwu'd last menstrual period was 10/25/2023 (exact date). EDC Estimated Date of Delivery: 07/31/24 Gestational Age at Delivery: [redacted]w[redacted]d   Antepartum complications:  Obesity- pregravid BMI: 40 Hx of anxiety/ depression- bipolar? Tobacco use Marijuana use in pregnancy Varicella non-immune - recommend vaccination postpartum RH Negative Mild Polyhydramnios Admitting Diagnosis:  SROM Secondary Diagnosis: primary cesarean section, failure to descend Patient Active Problem List   Diagnosis Date Noted   Amniotic fluid leaking 07/20/2024   Generalized anxiety disorder 04/02/2024   Depression, unspecified depression type 04/02/2024    Augmentation: Pitocin  Complications: None Intrapartum complications/course:  Date of Delivery:  Delivered By: IVAR Dinsmore MD Delivery Type: primary cesarean section, low transverse incision Anesthesia: epidural Placenta: spontaneous Laceration:  Episiotomy: none Newborn Data: Live born female  Birth Weight: 7 lb 15.7 oz (3620 g) APGAR: 8, 9  Newborn Delivery   Birth date/time: 07/21/2024 14:55:00 Delivery type:      Postpartum Procedures: none  Edinburgh:     07/23/2024    5:58 AM  Van Postnatal Depression Scale Screening Tool  I have been able to laugh and see the funny side of things. 0  I have looked forward with enjoyment to things. 0  I have blamed myself unnecessarily when things went wrong. 2  I have been anxious or worried for no good reason. 2  I have felt scared or panicky for no good reason. 1  Things have been getting on top of me. 2  I have been so unhappy that I have had difficulty sleeping. 1  I have felt sad or miserable. 1  I have been so unhappy that I have  been crying. 1  The thought of harming myself has occurred to me. 0  Edinburgh Postnatal Depression Scale Total 10    Post partum course: Patient had an uncomplicated postpartum course.  By time of discharge on POD#2, her pain was controlled on oral pain medications; she had appropriate lochia and was ambulating, voiding without difficulty, tolerating regular diet and passing flatus.   She was deemed stable for discharge to home.    Discharge Physical Exam:   BP 126/84 (BP Location: Left Arm)   Pulse 84   Temp 98.7 F (37.1 C) (Oral)   Resp 19   Ht 5' 5 (1.651 m)   Wt 117.9 kg   LMP 10/25/2023 (Exact Date)   SpO2 100%   Breastfeeding Unknown   BMI 43.27 kg/m   General: NAD CV: RRR Pulm: CTABL, nl effort ABD: s/nd/nt, fundus firm and below the umbilicus Lochia: moderate Incision: c/d/i DVT Evaluation: LE non-ttp, no evidence of DVT on exam.  Hemoglobin  Date Value Ref Range Status  07/22/2024 9.8 (L) 12.0 - 15.0 g/dL Final   HCT  Date Value Ref Range Status  07/22/2024 29.7 (L) 36.0 - 46.0 % Final   Risk assessment for postpartum VTE and prophylactic treatment: Very high risk factors: None High risk factors: None Moderate risk factors: Cesarean delivery  and BMI 40-60 kg/m2  Postpartum VTE prophylaxis with LMWH not indicated   Disposition: stable, discharge to home. Baby Feeding: breastmilk Baby Disposition: home with mom  Rh Immune globulin given: n/a, infant is O neg Rubella vaccine given: immune Tdap vaccine given in AP or PP setting: received AP Flu vaccine given in AP  or PP setting: received AP  Contraception: TBD  Prenatal Labs:  Blood type/Rh O neg  Antibody screen neg  Rubella Immune  Varicella Non - Immune  RPR NR  HBsAg Neg  HIV NR  GC neg  Chlamydia neg  Genetic screening negative  1 hour GTT 130  3 hour GTT    GBS Pos     Plan:  Ikesha Siller was discharged to home in good condition. Follow-up appointment with delivering provider  in 6 weeks.  Discharge Medications: Allergies as of 07/23/2024       Reactions   Wound Dressing Adhesive Itching, Rash   Any adhesive        Medication List     STOP taking these medications    ondansetron  4 MG disintegrating tablet Commonly known as: ZOFRAN -ODT   pantoprazole  40 MG tablet Commonly known as: Protonix    sucralfate  1 GM/10ML suspension Commonly known as: Carafate        TAKE these medications    acetaminophen  500 MG tablet Commonly known as: TYLENOL  Take 2 tablets (1,000 mg total) by mouth every 6 (six) hours as needed for mild pain (pain score 1-3) or fever.   ibuprofen  600 MG tablet Commonly known as: ADVIL  Take 1 tablet (600 mg total) by mouth every 6 (six) hours as needed for fever, mild pain (pain score 1-3) or cramping.   oxyCODONE  5 MG immediate release tablet Commonly known as: Oxy IR/ROXICODONE  Take 1-2 tablets (5-10 mg total) by mouth every 6 (six) hours as needed for up to 7 days for severe pain (pain score 7-10) or breakthrough pain.   senna-docusate 8.6-50 MG tablet Commonly known as: Senokot-S Take 2 tablets by mouth at bedtime as needed for mild constipation.   sertraline 100 MG tablet Commonly known as: ZOLOFT Take 100 mg by mouth daily.         Follow-up Information     Hermela Hardt, Debby PARAS, MD Follow up in 2 week(s).   Specialty: Obstetrics and Gynecology Why: incision check Contact information: [address] [place] [address] [phone]                 Signed:  Edsel Charlies Blush, CNM 07/23/2024 10:12 AM

## 2024-07-21 NOTE — Progress Notes (Signed)
 Patient ID: Sonya Ferguson, female   DOB: 1996/01/19, 28 y.o.   MRN: [id number] Pt now pushing  ssince 1030 .  Cx c/c +1/3 asynclitic. ++ pain  Recommend primary LTCS for arrest of descent  The risks of cesarean section discussed with the patient included but were not limited to: bleeding which may require transfusion or reoperation; infection which may require antibiotics; injury to bowel, bladder, ureters or other surrounding organs; injury to the fetus; need for additional procedures including hysterectomy in the event of a life-threatening hemorrhage; placental abnormalities wth subsequent pregnancies, incisional problems, thromboembolic phenomenon and other postoperative/anesthesia complications. The patient concurred with the proposed plan, giving informed written consent for the procedure.   . Anesthesia and OR aware. Preoperative prophylactic antibiotics and SCDs ordered on call to the OR.  To OR when ready.  Start ancef  and azithromycin for surgical prophylaxis

## 2024-07-21 NOTE — Transfer of Care (Signed)
 Immediate Anesthesia Transfer of Care Note  Patient: Sonya Ferguson  Procedure(s) Performed: CESAREAN DELIVERY  Patient Location: Mother/Baby  Anesthesia Type:Spinal  Level of Consciousness: awake, alert , and oriented  Airway & Oxygen Therapy: Patient Spontanous Breathing  Post-op Assessment: Report given to RN and Post -op Vital signs reviewed and stable  Post vital signs: Reviewed and stable  Last Vitals:  Vitals Value Taken Time  BP 100/70   Temp    Pulse 83   Resp 16   SpO2 95     Last Pain:  Vitals:   07/21/24 1402  TempSrc: Oral  PainSc:       Patients Stated Pain Goal: 0 (07/20/24 2200)  Complications: No notable events documented.

## 2024-07-21 NOTE — Anesthesia Procedure Notes (Signed)
 Spinal  Patient location during procedure: OR Start time: 07/21/2024 2:25 PM End time: 07/21/2024 2:27 PM Reason for block: surgical anesthesia Staffing Performed: resident/CRNA  Anesthesiologist: Leavy Ned, MD Resident/CRNA: Belinda Salm, CRNA Performed by: Belinda, Dawnyel Leven, CRNA Authorized by: Leavy Ned, MD   Preanesthetic Checklist Completed: patient identified, IV checked, site marked, risks and benefits discussed, surgical consent, monitors and equipment checked and pre-op evaluation Spinal Block Patient position: sitting Prep: ChloraPrep Patient monitoring: heart rate, continuous pulse ox and blood pressure Approach: midline Location: L3-4 Injection technique: single-shot Needle Needle type: Pencan  Needle gauge: 24 G Needle length: 10 cm Assessment Sensory level: T4 Events: CSF return Additional Notes IV functioning, monitors applied to pt. Expiration date of kit checked and confirmed to be in date. Sterile prep and drape, hand hygiene and sterile gloved used. Pt was positioned and spine was prepped in sterile fashion. Skin was anesthetized with lidocaine . Free flow of clear CSF obtained prior to injecting local anesthetic into CSF x 1 attempt. Spinal needle aspirated freely following injection. Needle was carefully withdrawn, and pt tolerated procedure well. Loss of motor and sensory on exam post injection.

## 2024-07-22 ENCOUNTER — Encounter: Payer: Self-pay | Admitting: Obstetrics and Gynecology

## 2024-07-22 LAB — CBC
HCT: 29.7 % — ABNORMAL LOW (ref 36.0–46.0)
Hemoglobin: 9.8 g/dL — ABNORMAL LOW (ref 12.0–15.0)
MCH: 28.3 pg (ref 26.0–34.0)
MCHC: 33 g/dL (ref 30.0–36.0)
MCV: 85.8 fL (ref 80.0–100.0)
Platelets: 249 K/uL (ref 150–400)
RBC: 3.46 MIL/uL — ABNORMAL LOW (ref 3.87–5.11)
RDW: 13.8 % (ref 11.5–15.5)
WBC: 22.2 K/uL — ABNORMAL HIGH (ref 4.0–10.5)
nRBC: 0 % (ref 0.0–0.2)

## 2024-07-22 MED ORDER — IBUPROFEN 600 MG PO TABS
600.0000 mg | ORAL_TABLET | Freq: Four times a day (QID) | ORAL | Status: DC
  Administered 2024-07-22 – 2024-07-23 (×5): 600 mg via ORAL
  Filled 2024-07-22 (×5): qty 1

## 2024-07-22 MED ORDER — FAMOTIDINE 20 MG PO TABS
20.0000 mg | ORAL_TABLET | Freq: Two times a day (BID) | ORAL | Status: DC
  Administered 2024-07-22 – 2024-07-23 (×3): 20 mg via ORAL
  Filled 2024-07-22 (×3): qty 1

## 2024-07-22 MED ORDER — ACETAMINOPHEN 500 MG PO TABS
1000.0000 mg | ORAL_TABLET | Freq: Four times a day (QID) | ORAL | Status: DC
  Administered 2024-07-22 – 2024-07-23 (×5): 1000 mg via ORAL
  Filled 2024-07-22 (×5): qty 2

## 2024-07-22 MED ORDER — CALCIUM CARBONATE ANTACID 500 MG PO CHEW
800.0000 mg | CHEWABLE_TABLET | Freq: Three times a day (TID) | ORAL | Status: DC | PRN
  Administered 2024-07-22: 800 mg via ORAL
  Filled 2024-07-22: qty 4

## 2024-07-22 NOTE — Anesthesia Postprocedure Evaluation (Signed)
 Anesthesia Post Note  Patient: Environmental Consultant  Procedure(s) Performed: CESAREAN DELIVERY  Patient location during evaluation: Mother Baby Anesthesia Type: Epidural and Spinal Level of consciousness: awake and alert Pain management: pain level controlled Vital Signs Assessment: post-procedure vital signs reviewed and stable Respiratory status: spontaneous breathing, nonlabored ventilation and respiratory function stable Cardiovascular status: stable Postop Assessment: no headache, no backache and epidural receding Anesthetic complications: no   No notable events documented.   Last Vitals:  Vitals:   07/22/24 0500 07/22/24 0615  BP:    Pulse: 89 83  Resp:    Temp:    SpO2: 98% 98%    Last Pain:  Vitals:   07/22/24 0432  TempSrc: Oral  PainSc:                  Madalyn Hammock

## 2024-07-22 NOTE — Lactation Note (Signed)
 This note was copied from a baby's chart. Lactation Consultation Note  Patient Name: Sonya Ferguson How Unijb'd Date: 07/22/2024 Age:28 hours Reason for consult: Follow-up assessment;Mother's request;Primapara;Early term 37-38.6wks;Breastfeeding assistance;RN request   Maternal Data This is mom's 1st baby, primary C/S arrest of descent. Mom with history of anxiety, and depression, tobacco use.   Mother requested LC assistance with breastfeeding Has patient been taught Hand Expression?: Yes Does the patient have breastfeeding experience prior to this delivery?: No  Feeding Mother's Current Feeding Choice: Breast Milk Provided tips and strategies to maximize position and latch techniques. LATCH Score Latch: Repeated attempts needed to sustain latch, nipple held in mouth throughout feeding, stimulation needed to elicit sucking reflex.  Audible Swallowing: Spontaneous and intermittent (once latched well baby was spontaneous and intermttent at the breast)  Type of Nipple: Everted at rest and after stimulation (left nipple short but erects well with stimulation)  Comfort (Breast/Nipple): Soft / non-tender  Hold (Positioning): Assistance needed to correctly position infant at breast and maintain latch. (Mother prefers cradle hold however baby had difficulty sustaining latch in cradle hold. Assisted mom with football hold,baby latched well, multiple swallows noted. Mom positioned baby at the left breast in cradle hold. After many attempts baby latched.)  LATCH Score: 8  Interventions Interventions: Breast feeding basics reviewed;Assisted with latch;Breast massage;Hand express;Breast compression;Adjust position;Support pillows;Position options;Education  Discharge Pump: DEBP;Hands Free (Mom has a wearable motiff roam pump.)  Consult Status Consult Status: Follow-up Date: 07/23/24 Follow-up type: In-patient  Update provided to care nurse.  [name] 07/22/2024, 8:49 PM

## 2024-07-22 NOTE — Progress Notes (Signed)
 Post Partum Day 1 Subjective: Doing well, no complaints.  Tolerating regular diet, pain with PO meds, voiding and ambulating without difficulty.  No CP SOB Fever,Chills, N/V or leg pain; denies nipple or breast pain, no HA change of vision, RUQ/epigastric pain  Objective: BP (!) 107/54   Pulse 88   Temp 98.3 F (36.8 C) (Oral)   Resp 20   Ht 5' 5 (1.651 m)   Wt 117.9 kg   LMP 10/25/2023 (Exact Date)   SpO2 100%   Breastfeeding Unknown   BMI 43.27 kg/m    Physical Exam:  General: NAD Breasts: soft/nontender CV: RRR Pulm: nl effort, CTABL Abdomen: soft, NT, BS x 4 Incision: Dsg CDI/tape dressing intact/no erythema or drainage Lochia: moderate Uterine Fundus: fundus firm and 1 fb below umbilicus DVT Evaluation: no cords, ttp LEs   Recent Labs    07/20/24 1715 07/22/24 0802  HGB 12.1 9.8*  HCT 36.0 29.7*  WBC 13.0* 22.2*  PLT 253 249    Assessment/Plan: 28 y.o. G1P1001 postpartum day # 1  - Continue routine PP care - Lactation consult PRN - Discussed contraceptive options including implant, IUDs hormonal and non-hormonal, injection, pills/ring/patch, condoms, and NFP.  - Acute blood loss anemia, clinically significant - hemoglobin changed from 12.1 to 9.8, patient is asymptomatic, hemodynamically stable; start po ferrous sulfate BID with stool softeners - Immunization status: Needs varicella prior to DC  Disposition: Does not desire Dc home today.   Edsel Charlies Blush, CNM 07/22/2024 11:07 AM

## 2024-07-22 NOTE — Lactation Note (Signed)
 This note was copied from a baby's chart. Lactation Consultation Note  Patient Name: Sonya Ferguson Date: 07/22/2024 Age:28 hours Reason for consult: Initial assessment;Primapara;Early term 37-38.6wks;RN request;Mother's request   Maternal Data This is mom's 1st baby, primary C/S arrest of descent. Mom with history of anxiety, and depression, tobacco use.  At initial visit mom reports baby has been mostly latching but is sleepier for some feeds. Discussed with mom normative breastfeeding behavior in the first 24 hours and what to expect as baby begins to cluster feed. Baby is skin to skin with dad post bath. Mom reports she is fatigued. Recommended mom rest when the baby rests between feedings. Mom with questions about compatibility of breastfeeding and drinking an energy drink that has 300 mg of caffeine. Has patient been taught Hand Expression?: Yes Does the patient have breastfeeding experience prior to this delivery?: No  Feeding Mother's Current Feeding Choice: Breast Milk   Interventions Interventions: Breast feeding basics reviewed;Hand express;Education Reviewed what to expect in first days when breastfeeding: feeding cues, 8-12 feeds in 24 hours, how to know the baby is getting enough,how to wake a sleepy baby,and cluster feeding. LC number on white board. Mom will call LC to assist with next feeding and use of breastpump.  Discussed with mom caffeine does transfer in to breastmilk with a Tmax of 60 minutes, the caffeine would be peaking in her breastmilk. The studies in Medication and Mother's Milk Dennis, 2025) specifically talk about caffeine in an average cup of coffee (generally 100 mg/cup with some coffees 150 mg/cup depending on preparation and county of origin). Per AAP it is recommended to limit intake of caffeine products to a total of 300 mg in 24 hours. Per Medication and Mother's Milk occasional use in the newborn period is acceptable but consistent chronic use  in the newborn period can lead to high plasma levels in the infant.Recommended mom breastfeed 1st and then take caffeine product immediately after breastfeeding. In respect to energy drinks it is recommended to exercise caution and only consume in moderation keeping in mind besides caffeine in an energy drink there are herbal ingredients in most energy drinks(taurine, guarana) which have not been studied.  Discussed infant monitoring for poor sleeping patterns, agitation, rapid heart rate, tremor Discharge Discharge Education: Engorgement and breast care;Warning signs for feeding baby Pump: DEBP;Hands Free (Mom has a wearable motiff roam pump.)  Consult Status Consult Status: Follow-up Date: 07/23/24 Follow-up type: In-patient  Update provided to care nurse.  Sonya Ferguson 07/22/2024, 4:37 PM

## 2024-07-23 MED ORDER — IBUPROFEN 600 MG PO TABS: 600.0000 mg | ORAL_TABLET | Freq: Four times a day (QID) | ORAL | 0 refills | Status: AC | PRN

## 2024-07-23 MED ORDER — OXYCODONE HCL 5 MG PO TABS: 5.0000 mg | ORAL_TABLET | Freq: Four times a day (QID) | ORAL | 0 refills | Status: AC | PRN

## 2024-07-23 MED ORDER — ACETAMINOPHEN 500 MG PO TABS: 1000.0000 mg | ORAL_TABLET | Freq: Four times a day (QID) | ORAL | 0 refills | Status: AC | PRN

## 2024-07-23 MED ORDER — OXYCODONE HCL 5 MG PO TABS
5.0000 mg | ORAL_TABLET | ORAL | Status: DC | PRN
  Administered 2024-07-23: 5 mg via ORAL
  Filled 2024-07-23: qty 1

## 2024-07-23 MED ORDER — SENNOSIDES-DOCUSATE SODIUM 8.6-50 MG PO TABS: 2.0000 | ORAL_TABLET | Freq: Every evening | ORAL | 0 refills | Status: AC | PRN

## 2024-07-23 NOTE — Discharge Instructions (Signed)
 Local & Regional Resources for New Mothers -- [facility] Area  Provider / Program What They Offer Contact Info / Notes [facility] - Behavioral Health Counseling services for stress, depression, anxiety, parenting challenges, family issues -- available to county residents. Appropriate for new or expecting mothers needing mental-health support.  [email or web address]://www.hines.net/ Phone: [phone] (main) or [phone] (appointments)  [email or web address].https://www.hines.net/  [employer] Maternal mental-health services for birthing people with children ages 71-5. Offers peer support, stress management, parenting support, postpartum wellness, and culturally responsive care. Services are on a sliding scale and available via telehealth statewide (including [place]).  Contact: [phone]) [phone]  Email: info@[employer] .org    [employer] -- [place]  Chapter ([facility]) Free hotline and support for perinatal mood disorders (pregnancy & postpartum). Offers peer support, provider directory, online support groups, help in multiple languages. Great for immediate emotional support or finding local therapists familiar with postpartum needs.  Hotline/Text (24/7): (415)583-5972 (English & Spanish)    [facility] ([place] / ) General mental health and crisis services, which may support new mothers experiencing anxiety, depression, or other stressors.   Crisis / Access Line: [phone] (24/7 through access center)   [facility] [place] ([facility]) For mothers experiencing domestic violence, abuse, or needing integrated support (legal aid, mental health, crisis, social services) -- helpful if postpartum stress intersects with family safety or housing issues.  [facility] Phone: [phone] (Office) or [phone] (Family Abuse Services crisis line)

## 2024-07-23 NOTE — Clinical Social Work Maternal (Signed)
  CLINICAL SOCIAL WORK MATERNAL/CHILD NOTE  Patient Details  Name: Sonya Ferguson MRN: [id number] Date of Birth: [birth year]  Date:  07/23/2024  Clinical Social Worker Initiating Note:  Sonya Ferguson Date/Time: Initiated:  07/23/24/0200     Child's Name:  Sonya Ferguson   Biological Parents:  Mother   Need for Interpreter:  None   Reason for Referral:  Other (Comment) (Edinburgh Score of 10)   Address:  [address] [place] [address]    Phone number:  [phone] (home)     Additional phone number:   Household Members/Support Persons (HM/SP):   Household Member/Support Person 1   HM/SP Name Relationship DOB or Age  HM/SP -1 Sonya Ferguson Significant Other    HM/SP -2        HM/SP -3        HM/SP -4        HM/SP -5        HM/SP -6        HM/SP -7        HM/SP -8          Natural Supports (not living in the home):  Immediate Family   Professional Supports:     Employment: Unemployed   Type of Work:     Education:  Some Materials Engineer arranged:    Surveyor, Quantity Resources:  Medicaid   Other Resources:      Cultural/Religious Considerations Which May Impact Care:  N/A  Strengths:  Ability to meet basic needs  , Compliance with medical plan  , Home prepared for child  , Pediatrician chosen   Psychotropic Medications:  Zoloft       Pediatrician:    Merchant Navy Officer List:   [facility]    [place] [facility]    [facility]  [facility]      Pediatrician Fax Number:    Risk Factors/Current Problems:  None   Cognitive State:  Insightful     Mood/Affect:  Bright     CSW Assessment: CSW and RNCM met with patient as she was leaving the building for fresh air. CSW was able to introduce self and role. CSW completed assessment in a private and disclosed area.   MOB confirmed her address and phone number. She reports feeling well but sore. She stated  that she has a history of mental illness but it is being managed with medication. She reports [facility] but [facility] no longer service connected. MOB was provided resources for Mental Health, PPD, Car seat safety and safe sleep. She was receptive to the information provided to her.   MOB reports that she is breast feeding and pumping. She reports having a good relationship with maternal and paternal supports.   Pediatrician will be with [facility]. MOB advised that she has the necessary items for baby.   CSW Plan/Description:  No Further Intervention Required/No Barriers to Discharge    Sonya Ferguson L Sonya Gentzler, LCSW 07/23/2024, 2:39 PM

## 2024-07-23 NOTE — Clinical Social Work Peds Assess (Signed)
 CLINICAL SOCIAL WORK PEDIATRIC ASSESSMENT NOTE  Patient Details  Name: Sonya Ferguson MRN: [id number] Date of Birth: [birth year]  Date:  07/23/2024  Clinical Social Worker Initiating Note:  Raniah Karan Date/Time: Initiated:  07/23/24/0200     Child's Name:  Sonya Ferguson   Biological Parents:      Need for Interpreter:  None   Reason for Referral:  Other (Comment) Selena Score of 10)   Address:        Phone number:       Household Members:  Parents   Natural Supports (not living in the home):  Immediate Family   Professional Supports:     Employment: Unemployed   Type of Work:     Education:      Architect:  Medicaid   Other Resources:      Cultural/Religious Considerations Which May Impact Care:    Strengths:      Risk Factors/Current Problems:  None   Cognitive State:  CLINICAL SOCIAL WORK MATERNAL/CHILD NOTE   Patient Details  Name: Sonya Ferguson MRN: [id number] Date of Birth: January 18, 1996   Date:  07/23/2024   Clinical Social Worker Initiating Note:  Niyonna Betsill    Date/Time: Initiated:  07/23/24/0200      Child's Name:  Sonya Ferguson    Biological Parents:  Mother    Need for Interpreter:  None    Reason for Referral:  Other (Comment) (Edinburgh Score of 10)    Address:  [address] [place] [address]    Phone number:  [phone] (home)      Additional phone number:    Household Members/Support Persons (HM/SP):   Household Member/Support Person 1     HM/SP Name Relationship DOB or Age  HM/SP -1 Penne Ferguson Significant Other   HM/SP -2     HM/SP -3     HM/SP -4     HM/SP -5     HM/SP -6     HM/SP -7     HM/SP -8         Natural Supports (not living in the home):  Immediate Family    Professional Supports:      Employment: Unemployed    Type of Work:      Education:  Some Economist arranged:     Surveyor, Quantity Resources:  Medicaid    Other Resources:        Cultural/Religious Considerations Which May Impact Care:  N/A   Strengths:  Ability to meet basic needs  , Compliance with medical plan  , Home prepared for child  , Pediatrician chosen    Psychotropic Medications:  Zoloft        Pediatrician:    Warehouse Manager List:    [facility]   [place]   [facility]  [facility]       Pediatrician Fax Number:     Risk Factors/Current Problems:  None    Cognitive State:  Insightful      Mood/Affect:  Bright      CSW Assessment: CSW and RNCM met with patient as she was leaving the building for fresh air. CSW was able to introduce self and role. CSW completed assessment in a private and disclosed area.    MOB confirmed her address and phone number. She reports feeling well but sore. She stated that she has a history of mental illness but it  is being managed with medication. She reports that she [facility] but is no longer service connected. MOB was provided resources for Mental Health, PPD, Car seat safety and safe sleep. She was receptive to the information provided to her.    MOB reports that she is breast feeding and pumping. She reports having a good relationship with maternal and paternal supports.    Pediatrician will be with [facility]. MOB advised that she has the necessary items for baby.    CSW Plan/Description:  No Further Intervention Required/No Barriers to Discharge        Mood/Affect:  Bright      CSW Plan/Description:  No Further Intervention Required/No Barriers to Discharge    Jerad Dunlap L Neveyah Garzon, LCSW 07/23/2024, 2:43 PM

## 2024-07-23 NOTE — Lactation Note (Signed)
 This note was copied from a baby's chart. Lactation Consultation Note  Patient Name: Sonya Ferguson Unijb'd Date: 07/23/2024 Age:28 hours Reason for consult: Follow-up assessment;Primapara;Early term 37-38.6wks;Maternal discharge   Maternal Data Has patient been taught Hand Expression?: Yes Does the patient have breastfeeding experience prior to this delivery?: No  Feeding Mother's Current Feeding Choice: Breast Milk  Baby asleep on mom's chest, had circ this am, mom states baby is latching well and she hears swallows when nursing.      Latch:  (I did not observe a feeding)                  Lactation Tools Discussed/Used    Interventions Interventions: Education LC name updated on white board Discharge Pump: Personal WIC Program: Yes  Consult Status Consult Status: PRN Date: 07/23/24 Follow-up type: In-patient    Aldona JONETTA Converse 07/23/2024, 2:14 PM
